# Patient Record
Sex: Male | Born: 1975 | Race: Black or African American | Hispanic: No | Marital: Married | Smoking: Never smoker
Health system: Southern US, Community
[De-identification: ages and names within clinical notes are randomized; demographics above are authoritative.]

## PROBLEM LIST (undated history)

## (undated) DIAGNOSIS — E669 Obesity, unspecified: Secondary | ICD-10-CM

## (undated) DIAGNOSIS — I10 Essential (primary) hypertension: Secondary | ICD-10-CM

## (undated) DIAGNOSIS — E870 Hyperosmolality and hypernatremia: Secondary | ICD-10-CM

## (undated) HISTORY — PX: ANKLE SURGERY: SHX546

## (undated) HISTORY — DX: Essential (primary) hypertension: I10

## (undated) HISTORY — DX: Hyperosmolality and hypernatremia: E87.0

## (undated) HISTORY — DX: Obesity, unspecified: E66.9

---

## 2017-03-02 ENCOUNTER — Emergency Department (HOSPITAL_COMMUNITY)
Admission: EM | Admit: 2017-03-02 | Discharge: 2017-03-02 | Disposition: A | Discharge status: Home / Self Care | Payer: Worker's Compensation | Attending: Physician Assistant | Admitting: Physician Assistant

## 2017-03-02 ENCOUNTER — Emergency Department: Payer: Worker's Compensation

## 2017-03-02 ENCOUNTER — Encounter (HOSPITAL_COMMUNITY): Payer: Self-pay | Admitting: Emergency Medicine

## 2017-03-02 ENCOUNTER — Encounter: Payer: Self-pay | Admitting: Emergency Medicine

## 2017-03-02 ENCOUNTER — Emergency Department
Admission: EM | Admit: 2017-03-02 | Discharge: 2017-03-02 | Disposition: A | Discharge status: Home / Self Care | Payer: Worker's Compensation | Attending: Emergency Medicine | Admitting: Emergency Medicine

## 2017-03-02 DIAGNOSIS — S0003XD Contusion of scalp, subsequent encounter: Secondary | ICD-10-CM | POA: Diagnosis not present

## 2017-03-02 DIAGNOSIS — Y9389 Activity, other specified: Secondary | ICD-10-CM | POA: Insufficient documentation

## 2017-03-02 DIAGNOSIS — R110 Nausea: Secondary | ICD-10-CM | POA: Diagnosis present

## 2017-03-02 DIAGNOSIS — S8002XA Contusion of left knee, initial encounter: Secondary | ICD-10-CM

## 2017-03-02 DIAGNOSIS — S0990XA Unspecified injury of head, initial encounter: Secondary | ICD-10-CM | POA: Diagnosis not present

## 2017-03-02 DIAGNOSIS — S060X0D Concussion without loss of consciousness, subsequent encounter: Secondary | ICD-10-CM | POA: Diagnosis not present

## 2017-03-02 DIAGNOSIS — T07XXXA Unspecified multiple injuries, initial encounter: Secondary | ICD-10-CM | POA: Insufficient documentation

## 2017-03-02 DIAGNOSIS — Y92410 Unspecified street and highway as the place of occurrence of the external cause: Secondary | ICD-10-CM | POA: Diagnosis not present

## 2017-03-02 DIAGNOSIS — Y990 Civilian activity done for income or pay: Secondary | ICD-10-CM | POA: Insufficient documentation

## 2017-03-02 LAB — COMPREHENSIVE METABOLIC PANEL
ALBUMIN: 3.9 g/dL (ref 3.5–5.0)
ALK PHOS: 61 U/L (ref 38–126)
ALT: 28 U/L (ref 17–63)
ALT: 29 U/L (ref 17–63)
ANION GAP: 11 (ref 5–15)
ANION GAP: 7 (ref 5–15)
AST: 30 U/L (ref 15–41)
AST: 31 U/L (ref 15–41)
Albumin: 4.2 g/dL (ref 3.5–5.0)
Alkaline Phosphatase: 62 U/L (ref 38–126)
BILIRUBIN TOTAL: 0.7 mg/dL (ref 0.3–1.2)
BUN: 21 mg/dL — ABNORMAL HIGH (ref 6–20)
BUN: 15 mg/dL (ref 6–20)
CALCIUM: 9.2 mg/dL (ref 8.9–10.3)
CHLORIDE: 109 mmol/L (ref 101–111)
CO2: 24 mmol/L (ref 22–32)
CO2: 24 mmol/L (ref 22–32)
Calcium: 9 mg/dL (ref 8.9–10.3)
Chloride: 107 mmol/L (ref 101–111)
Creatinine, Ser: 0.83 mg/dL (ref 0.61–1.24)
Creatinine, Ser: 0.85 mg/dL (ref 0.61–1.24)
GFR calc Af Amer: >60 mL/min (ref >60)
GLUCOSE: 105 mg/dL — AB (ref 65–99)
Glucose, Bld: 106 mg/dL — ABNORMAL HIGH (ref 65–99)
POTASSIUM: 3.6 mmol/L (ref 3.5–5.1)
POTASSIUM: 3.7 mmol/L (ref 3.5–5.1)
Sodium: 142 mmol/L (ref 135–145)
Sodium: 140 mmol/L (ref 135–145)
TOTAL PROTEIN: 7.3 g/dL (ref 6.5–8.1)
TOTAL PROTEIN: 6.8 g/dL (ref 6.5–8.1)
Total Bilirubin: 1.1 mg/dL (ref 0.3–1.2)

## 2017-03-02 LAB — CBC
HEMATOCRIT: 43.1 % (ref 40.0–52.0)
HEMATOCRIT: 41.9 % (ref 39.0–52.0)
HEMOGLOBIN: 14.1 g/dL (ref 13.0–18.0)
HEMOGLOBIN: 13.5 g/dL (ref 13.0–17.0)
MCH: 26 pg (ref 26.0–34.0)
MCH: 25.9 pg — ABNORMAL LOW (ref 26.0–34.0)
MCHC: 32.7 g/dL (ref 32.0–36.0)
MCHC: 32.2 g/dL (ref 30.0–36.0)
MCV: 79.6 fL — ABNORMAL LOW (ref 80.0–100.0)
MCV: 80.3 fL (ref 78.0–100.0)
Platelets: 193 10*3/uL (ref 150–440)
Platelets: 200 10*3/uL (ref 150–400)
RBC: 5.41 MIL/uL (ref 4.40–5.90)
RBC: 5.22 MIL/uL (ref 4.22–5.81)
RDW: 15 % — ABNORMAL HIGH (ref 11.5–14.5)
RDW: 14.7 % (ref 11.5–15.5)
WBC: 8.4 10*3/uL (ref 3.8–10.6)
WBC: 10.2 10*3/uL (ref 4.0–10.5)

## 2017-03-02 LAB — LIPASE, BLOOD: LIPASE: 31 U/L (ref 11–51)

## 2017-03-02 LAB — PROTIME-INR
INR: 0.99
PROTHROMBIN TIME: 13 s (ref 11.4–15.2)

## 2017-03-02 LAB — URINALYSIS, ROUTINE W REFLEX MICROSCOPIC
Bilirubin Urine: NEGATIVE
GLUCOSE, UA: NEGATIVE mg/dL
Hgb urine dipstick: NEGATIVE
KETONES UR: 5 mg/dL — AB
LEUKOCYTES UA: NEGATIVE
NITRITE: NEGATIVE
PH: 5 (ref 5.0–8.0)
Protein, ur: NEGATIVE mg/dL
Specific Gravity, Urine: 1.032 — ABNORMAL HIGH (ref 1.005–1.030)

## 2017-03-02 LAB — APTT: aPTT: 25 seconds (ref 24–36)

## 2017-03-02 MED ORDER — KETOROLAC TROMETHAMINE 30 MG/ML IJ SOLN
30.0000 mg | Freq: Once | INTRAMUSCULAR | Status: AC
  Administered 2017-03-02: 30 mg via INTRAVENOUS
  Filled 2017-03-02: qty 1

## 2017-03-02 MED ORDER — BUTALBITAL-APAP-CAFFEINE 50-325-40 MG PO TABS: 1.0000 | ORAL_TABLET | Freq: Four times a day (QID) | ORAL | 0 refills | Status: AC | PRN

## 2017-03-02 MED ORDER — ONDANSETRON 4 MG PO TBDP
4.0000 mg | ORAL_TABLET | Freq: Once | ORAL | Status: AC | PRN
  Administered 2017-03-02: 4 mg via ORAL

## 2017-03-02 MED ORDER — METOCLOPRAMIDE HCL 5 MG/ML IJ SOLN
10.0000 mg | Freq: Once | INTRAMUSCULAR | Status: AC
  Administered 2017-03-02: 10 mg via INTRAVENOUS
  Filled 2017-03-02: qty 2

## 2017-03-02 MED ORDER — SODIUM CHLORIDE 0.9 % IV BOLUS (SEPSIS)
1000.0000 mL | Freq: Once | INTRAVENOUS | Status: AC
  Administered 2017-03-02: 1000 mL via INTRAVENOUS

## 2017-03-02 MED ORDER — ONDANSETRON 4 MG PO TBDP
ORAL_TABLET | ORAL | Status: AC
  Filled 2017-03-02: qty 1

## 2017-03-02 MED ORDER — DIPHENHYDRAMINE HCL 50 MG/ML IJ SOLN
25.0000 mg | Freq: Once | INTRAMUSCULAR | Status: AC
  Administered 2017-03-02: 25 mg via INTRAVENOUS
  Filled 2017-03-02: qty 1

## 2017-03-02 MED ORDER — ONDANSETRON 4 MG PO TBDP: 4.0000 mg | ORAL_TABLET | Freq: Three times a day (TID) | ORAL | 0 refills | Status: DC | PRN

## 2017-03-02 MED ORDER — IBUPROFEN 400 MG PO TABS
400.0000 mg | ORAL_TABLET | Freq: Once | ORAL | Status: AC
  Administered 2017-03-02: 400 mg via ORAL
  Filled 2017-03-02: qty 1
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
## 2017-03-02 NOTE — ED Notes (Signed)
Wounds to left arm cleansed with soap and water. Abrasions and small lacerations present. Bleeding controlled and open to air. Pt also has small raised area on right upper thigh, informed dr. Cyril Loosen - told to have patient put ice on it. Pt given ice pack.
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
## 2017-03-02 NOTE — ED Notes (Signed)
Patient transported to CT and XR 

## 2017-03-02 NOTE — ED Notes (Signed)
TYPE and SCREEN tube and paperwork sent to the lab.
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
## 2017-03-13 ENCOUNTER — Other Ambulatory Visit: Payer: Self-pay | Admitting: Family Medicine

## 2017-03-13 ENCOUNTER — Ambulatory Visit
Admission: RE | Admit: 2017-03-13 | Discharge: 2017-03-13 | Disposition: A | Discharge status: Home / Self Care | Payer: Worker's Compensation | Source: Ambulatory Visit | Attending: Family Medicine | Admitting: Family Medicine

## 2017-03-13 DIAGNOSIS — M79605 Pain in left leg: Secondary | ICD-10-CM | POA: Diagnosis not present

## 2017-03-13 DIAGNOSIS — M1712 Unilateral primary osteoarthritis, left knee: Secondary | ICD-10-CM | POA: Diagnosis not present

## 2017-03-13 DIAGNOSIS — R609 Edema, unspecified: Secondary | ICD-10-CM | POA: Diagnosis not present

## 2018-01-02 DIAGNOSIS — M179 Osteoarthritis of knee, unspecified: Secondary | ICD-10-CM | POA: Diagnosis not present

## 2018-01-02 DIAGNOSIS — Z1389 Encounter for screening for other disorder: Secondary | ICD-10-CM | POA: Diagnosis not present

## 2018-01-02 DIAGNOSIS — Z0000 Encounter for general adult medical examination without abnormal findings: Secondary | ICD-10-CM | POA: Diagnosis not present

## 2018-01-16 DIAGNOSIS — M1712 Unilateral primary osteoarthritis, left knee: Secondary | ICD-10-CM | POA: Diagnosis not present

## 2018-01-16 DIAGNOSIS — M1711 Unilateral primary osteoarthritis, right knee: Secondary | ICD-10-CM | POA: Diagnosis not present
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
## 2018-06-27 DIAGNOSIS — J069 Acute upper respiratory infection, unspecified: Secondary | ICD-10-CM | POA: Diagnosis not present

## 2018-10-07 DIAGNOSIS — R21 Rash and other nonspecific skin eruption: Secondary | ICD-10-CM | POA: Diagnosis not present

## 2019-02-05 ENCOUNTER — Ambulatory Visit: Payer: Self-pay | Admitting: Internal Medicine

## 2019-02-27 ENCOUNTER — Ambulatory Visit: Payer: Self-pay

## 2019-02-27 DIAGNOSIS — Z23 Encounter for immunization: Secondary | ICD-10-CM

## 2019-03-12 IMAGING — US US EXTREM LOW VENOUS*L*
1 series · 13 of 24 positions shown · non-contrast
Comparison: None.

CLINICAL DATA: Edema.  Evaluate for DVT.



[Series 1: us extrem low venous*left* · 0.10mm/px · 13 of 34 slices shown]
[im 1/34]
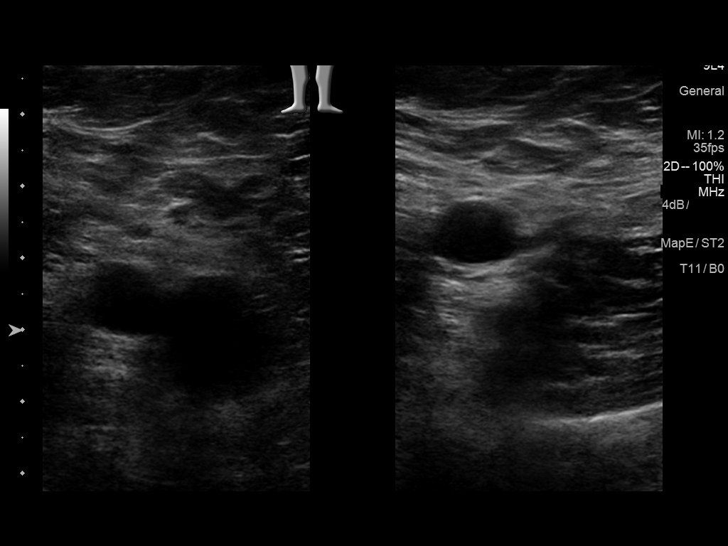
[im 3/34]
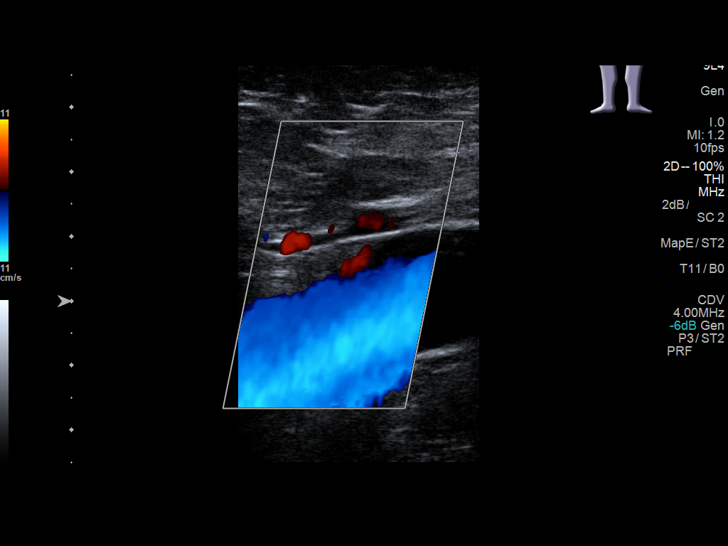
[im 6/34]
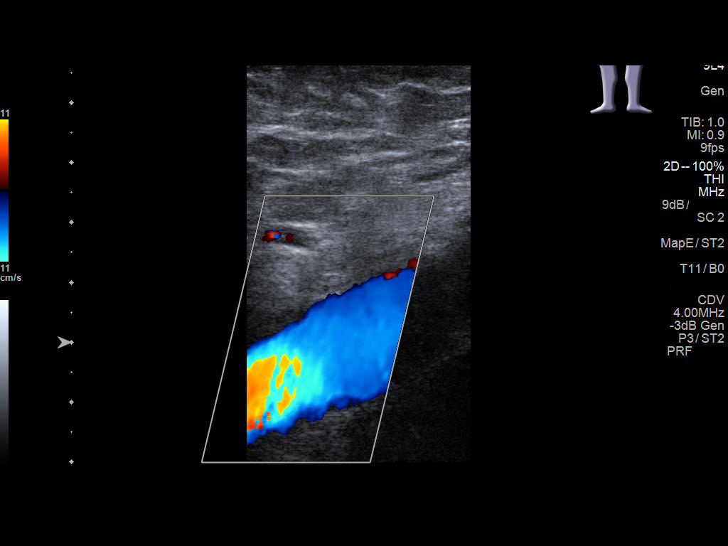
[im 9/34]
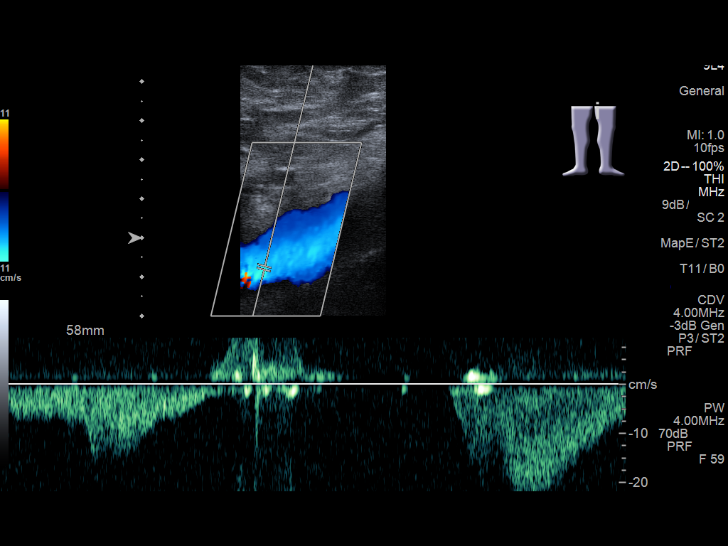
[im 12/34]
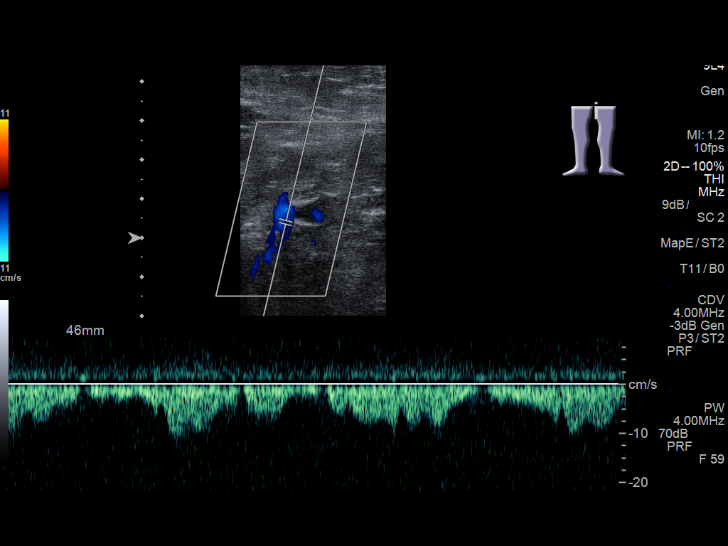
[im 15/34]
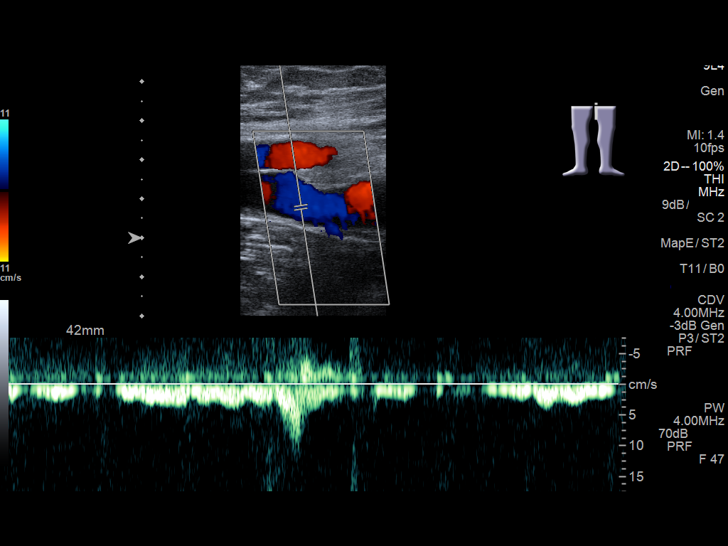
[im 18/34]
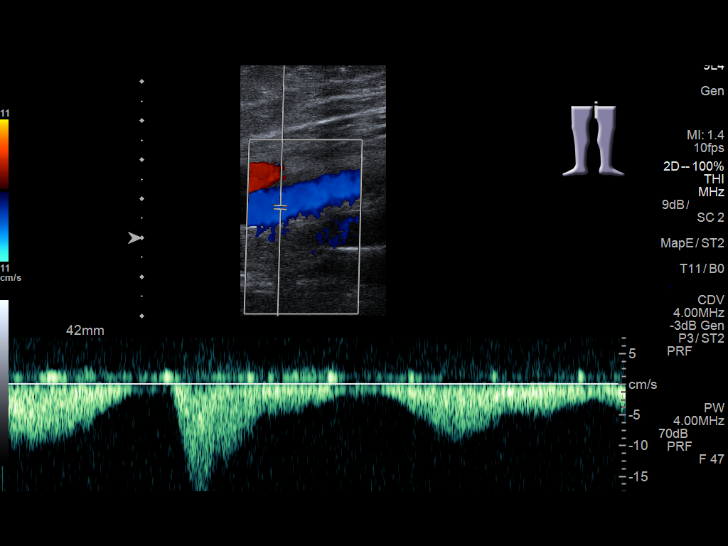
[im 19/34]
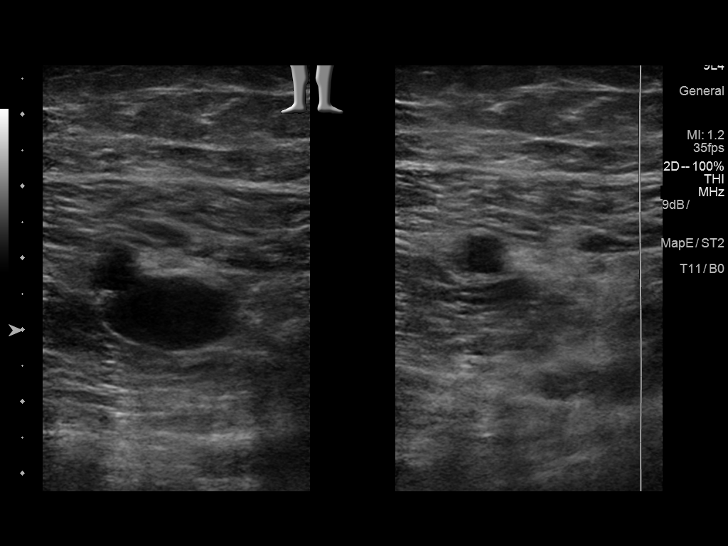
[im 22/34]
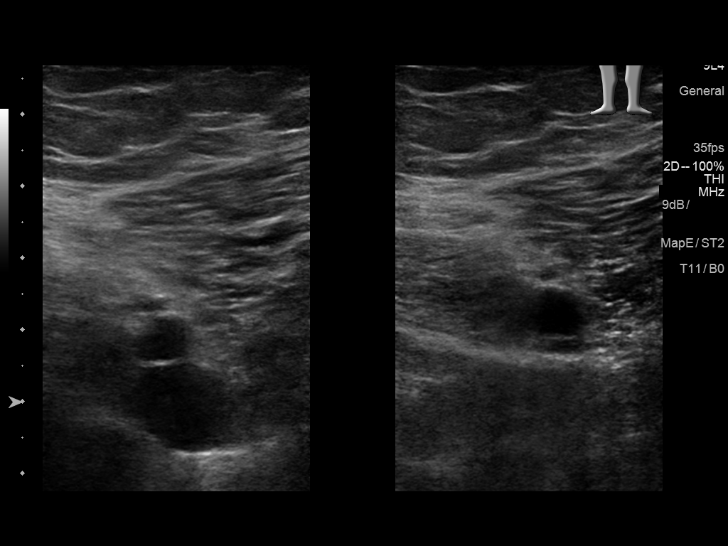
[im 25/34]
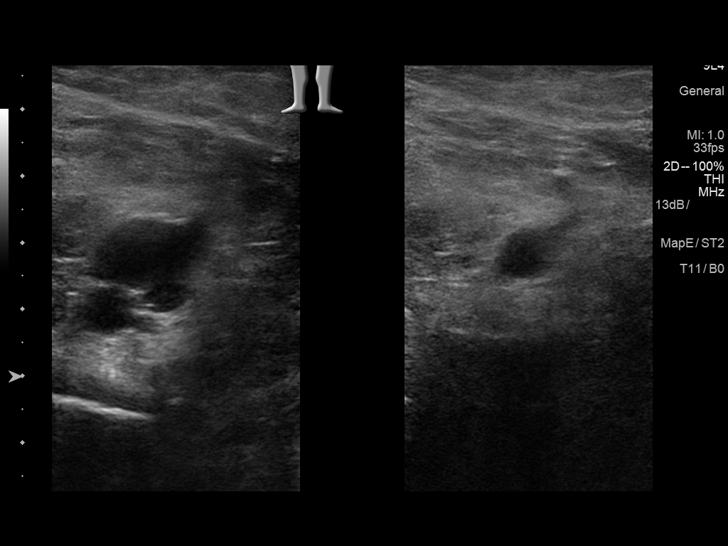
[im 28/34]
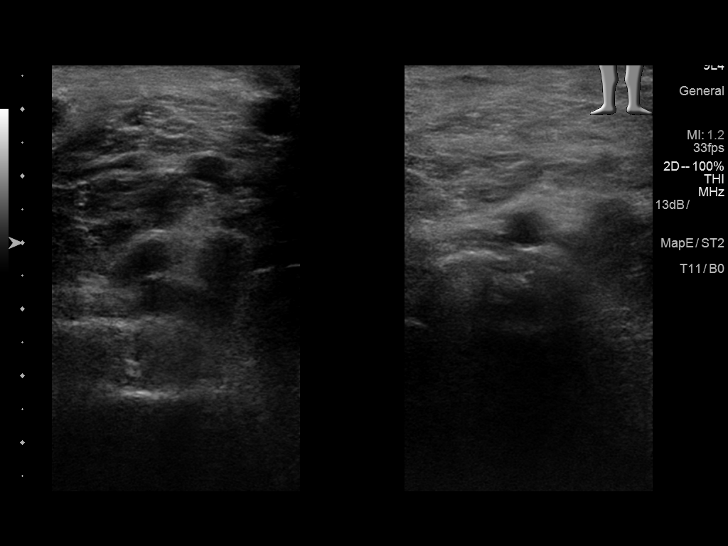
[im 31/34]
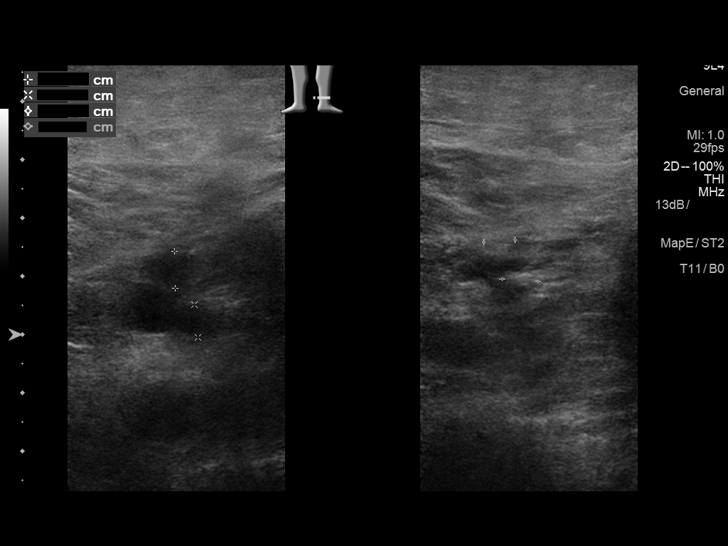
[im 34/34]
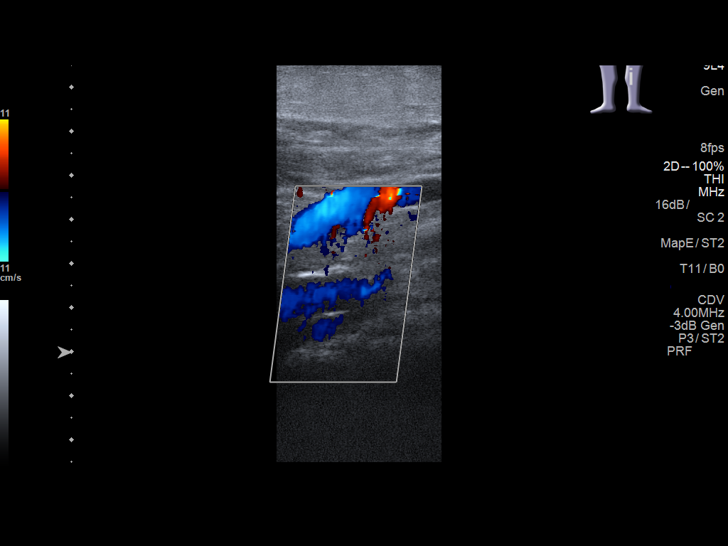

[13 of 24 positions shown; findings below may reference images not displayed]

FINDINGS: Contralateral Common Femoral Vein: Respiratory phasicity is normal
and symmetric with the symptomatic side. No evidence of thrombus.
Normal compressibility.

Common Femoral Vein: No evidence of thrombus. Normal
compressibility, respiratory phasicity and response to augmentation.

Saphenofemoral Junction: No evidence of thrombus. Normal
compressibility and flow on color Doppler imaging.

Profunda Femoral Vein: No evidence of thrombus. Normal
compressibility and flow on color Doppler imaging.

Femoral Vein: No evidence of thrombus. Normal compressibility,
respiratory phasicity and response to augmentation.

Popliteal Vein: No evidence of thrombus. Normal compressibility,
respiratory phasicity and response to augmentation.

Calf Veins: No evidence of thrombus. Normal compressibility and flow
on color Doppler imaging.

Superficial Great Saphenous Vein: No evidence of thrombus. Normal
compressibility.

Other Findings:  None.
IMPRESSION: No evidence of deep venous thrombosis.

## 2019-03-19 ENCOUNTER — Encounter: Payer: Self-pay | Admitting: Registered Nurse

## 2019-03-19 ENCOUNTER — Ambulatory Visit: Payer: 59 | Admitting: Registered Nurse

## 2019-03-19 ENCOUNTER — Other Ambulatory Visit: Payer: Self-pay

## 2019-03-19 VITALS — BP 140/80 | HR 64 | Temp 98.5°F | Resp 12 | Ht 69.0 in | Wt 270.0 lb

## 2019-03-19 DIAGNOSIS — I10 Essential (primary) hypertension: Secondary | ICD-10-CM

## 2019-03-19 DIAGNOSIS — J301 Allergic rhinitis due to pollen: Secondary | ICD-10-CM

## 2019-03-19 DIAGNOSIS — Z0289 Encounter for other administrative examinations: Secondary | ICD-10-CM

## 2019-03-19 LAB — POCT URINALYSIS DIPSTICK
Bilirubin, UA: NEGATIVE
Blood, UA: NEGATIVE
Glucose, UA: NEGATIVE
Ketones, UA: NEGATIVE
Leukocytes, UA: NEGATIVE
Nitrite, UA: NEGATIVE
Protein, UA: NEGATIVE
Spec Grav, UA: 1.025 (ref 1.010–1.025)
Urobilinogen, UA: 0.2 E.U./dL
pH, UA: 6 (ref 5.0–8.0)

## 2019-03-19 NOTE — Patient Instructions (Addendum)
Preventing Health Risks of Being Overweight Maintaining a healthy body weight is an important part of your overall health. Your healthy body weight depends on your age, gender, and height. Being overweight puts you at risk for many health problems, including:  Heart disease.  Diabetes.  Problems sleeping.  Joint problems. You can make changes to your diet and lifestyle to prevent these risks. Consider working with a health care provider or a dietitian to make these changes. What nutrition changes can be made?   Eat only as much as your body needs. In most cases, this is about 2,000 calories a day, but the amount varies depending on your height, gender, and activity level. Ask your health care provider how many calories you should have each day. Eating more than your body needs on a regular basis can cause you to become overweight or obese.  Eat slowly, and stop eating when you feel full.  Choose healthy foods, including: ? Fruits and vegetables. ? Lean meats. ? Low-fat dairy products. ? High-fiber foods, such as whole grains and beans. ? Healthy snacks like vegetable sticks, a piece of fruit, or a small amount of yogurt or cheese.  Avoid foods and drinks that are high in sugar, salt (sodium), saturated fat, or trans fat. This includes: ? Many desserts such as candy, cookies, and ice cream. ? Soda. ? Fried foods. ? Processed meats such as hot dogs or lunch meats. ? Prepackaged snack foods. What lifestyle changes can be made?   Exercise for at least 150 minutes a week to prevent weight gain, or as often as recommended by your health care provider. Do moderate-intensity exercise, such as brisk walking. ? Spread it out by exercising for 30 minutes 5 days a week, or in short 10-minute bursts several times a day.  Find other ways to stay active and burn calories, such as yard work or a hobby that involves physical activity.  Get at least 8 hours of sleep each night. When you are  well-rested, you are more likely to be active and make healthy choices during the day. To sleep better: ? Try to go to bed and wake up at about the same time every day. ? Keep your bedroom dark, quiet, and cool. ? Make sure that your bed is comfortable. ? Avoid stimulating activities, such as watching television or exercising, for at least one hour before bedtime. Why are these changes important? Eating healthy and being active helps you lose weight and prevent health problems caused by being overweight. Making these changes can also help you manage stress, feel better mentally, and connect with friends and family. What can happen if changes are not made? Being overweight can affect you for your entire life. You may develop joint or bone problems that make it painful or difficult for you to play sports or do activities you enjoy. Being overweight puts stress on your heart and lungs and can lead to medical problems like diabetes, heart disease, and sleeping problems. Where to find support You can get support for preventing health risks of being overweight from:  Your health care provider or a dietitian. They can provide guidance about healthy eating and healthy lifestyle choices.  Weight loss support groups, online or in-person. Where to find more information  MyPlate: FormerBoss.no ? This an online tool that provides personalized recommendations about foods to eat each day.  The Centers for Disease Control and Prevention: http://sharp-hammond.biz/ ? This resource gives tips for managing weight and having an active lifestyle.  Summary  To prevent unhealthy weight gain, it is important to maintain a healthy diet high in vegetables and whole grains, exercise regularly, and get at least 8 hours of sleep each night.  Making these changes helps prevent many long-term (chronic) health conditions that can shorten your life, such as diabetes, heart disease, and stroke. This information is  not intended to replace advice given to you by your health care provider. Make sure you discuss any questions you have with your health care provider. Document Released: 04/11/2017 Document Revised: 05/18/2017 Document Reviewed: 04/11/2017 Elsevier Patient Education  2020 [employer]. Managing Your Hypertension Hypertension is commonly called high blood pressure. This is when the force of your blood pressing against the walls of your arteries is too strong. Arteries are blood vessels that carry blood from your heart throughout your body. Hypertension forces the heart to work harder to pump blood, and may cause the arteries to become narrow or stiff. Having untreated or uncontrolled hypertension can cause heart attack, stroke, kidney disease, and other problems. What are blood pressure readings? A blood pressure reading consists of a higher number over a lower number. Ideally, your blood pressure should be below 120/80. The first ("top") number is called the systolic pressure. It is a measure of the pressure in your arteries as your heart beats. The second ("bottom") number is called the diastolic pressure. It is a measure of the pressure in your arteries as the heart relaxes. What does my blood pressure reading mean? Blood pressure is classified into four stages. Based on your blood pressure reading, your health care provider may use the following stages to determine what type of treatment you need, if any. Systolic pressure and diastolic pressure are measured in a unit called mm Hg. Normal  Systolic pressure: below 123456.  Diastolic pressure: below 80. Elevated  Systolic pressure: [id number].  Diastolic pressure: below 80. Hypertension stage 1  Systolic pressure: 0000000.  Diastolic pressure: [id number]. Hypertension stage 2  Systolic pressure: [id number] or above.  Diastolic pressure: 90 or above. What health risks are associated with hypertension? Managing your hypertension is an important  responsibility. Uncontrolled hypertension can lead to:  A heart attack.  A stroke.  A weakened blood vessel (aneurysm).  Heart failure.  Kidney damage.  Eye damage.  Metabolic syndrome.  Memory and concentration problems. What changes can I make to manage my hypertension? Hypertension can be managed by making lifestyle changes and possibly by taking medicines. Your health care provider will help you make a plan to bring your blood pressure within a normal range. Eating and drinking   Eat a diet that is high in fiber and potassium, and low in salt (sodium), added sugar, and fat. An example eating plan is called the DASH (Dietary Approaches to Stop Hypertension) diet. To eat this way: ? Eat plenty of fresh fruits and vegetables. Try to fill half of your plate at each meal with fruits and vegetables. ? Eat whole grains, such as whole wheat pasta, brown rice, or whole grain bread. Fill about one quarter of your plate with whole grains. ? Eat low-fat diary products. ? Avoid fatty cuts of meat, processed or cured meats, and poultry with skin. Fill about one quarter of your plate with lean proteins such as fish, chicken without skin, beans, eggs, and tofu. ? Avoid premade and processed foods. These tend to be higher in sodium, added sugar, and fat.  Reduce your daily sodium intake. Most people with hypertension should eat less than 1,500  mg of sodium a day.  Limit alcohol intake to no more than 1 drink a day for nonpregnant women and 2 drinks a day for men. One drink equals 12 oz of beer, 5 oz of wine, or 1 oz of hard liquor. Lifestyle  Work with your health care provider to maintain a healthy body weight, or to lose weight. Ask what an ideal weight is for you.  Get at least 30 minutes of exercise that causes your heart to beat faster (aerobic exercise) most days of the week. Activities may include walking, swimming, or biking.  Include exercise to strengthen your muscles (resistance  exercise), such as weight lifting, as part of your weekly exercise routine. Try to do these types of exercises for 30 minutes at least 3 days a week.  Do not use any products that contain nicotine or tobacco, such as cigarettes and e-cigarettes. If you need help quitting, ask your health care provider.  Control any long-term (chronic) conditions you have, such as high cholesterol or diabetes. Monitoring  Monitor your blood pressure at home as told by your health care provider. Your personal target blood pressure may vary depending on your medical conditions, your age, and other factors.  Have your blood pressure checked regularly, as often as told by your health care provider. Working with your health care provider  Review all the medicines you take with your health care provider because there may be side effects or interactions.  Talk with your health care provider about your diet, exercise habits, and other lifestyle factors that may be contributing to hypertension.  Visit your health care provider regularly. Your health care provider can help you create and adjust your plan for managing hypertension. Will I need medicine to control my blood pressure? Your health care provider may prescribe medicine if lifestyle changes are not enough to get your blood pressure under control, and if:  Your systolic blood pressure is 130 or higher.  Your diastolic blood pressure is 80 or higher. Take medicines only as told by your health care provider. Follow the directions carefully. Blood pressure medicines must be taken as prescribed. The medicine does not work as well when you skip doses. Skipping doses also puts you at risk for problems. Contact a health care provider if:  You think you are having a reaction to medicines you have taken.  You have repeated (recurrent) headaches.  You feel dizzy.  You have swelling in your ankles.  You have trouble with your vision. Get help right away if:   You develop a severe headache or confusion.  You have unusual weakness or numbness, or you feel faint.  You have severe pain in your chest or abdomen.  You vomit repeatedly.  You have trouble breathing. Summary  Hypertension is when the force of blood pumping through your arteries is too strong. If this condition is not controlled, it may put you at risk for serious complications.  Your personal target blood pressure may vary depending on your medical conditions, your age, and other factors. For most people, a normal blood pressure is less than 120/80.  Hypertension is managed by lifestyle changes, medicines, or both. Lifestyle changes include weight loss, eating a healthy, low-sodium diet, exercising more, and limiting alcohol. This information is not intended to replace advice given to you by your health care provider. Make sure you discuss any questions you have with your health care provider. Document Released: 02/07/2012 Document Revised: 09/06/2018 Document Reviewed: 04/12/2016 Elsevier Patient Education  2020  [employer]   How to Perform a Sinus Rinse A sinus rinse is a home treatment that is used to rinse your sinuses with a sterile mixture of salt and water (saline solution). Sinuses are air-filled spaces in your skull behind the bones of your face and forehead that open into your nasal cavity. A sinus rinse can help to clear mucus, dirt, dust, or pollen from your nasal cavity. You may do a sinus rinse when you have a cold, a virus, nasal allergy symptoms, a sinus infection, or stuffiness in your nose or sinuses. Talk with your health care provider about whether a sinus rinse might help you. What are the risks? A sinus rinse is generally safe and effective. However, there are a few risks, which include:  A burning sensation in your sinuses. This may happen if you do not make the saline solution as directed. Be sure to follow all directions when making the saline solution.   Nasal irritation.  Infection from contaminated water. This is rare, but possible. Do not do a sinus rinse if you have had ear or nasal surgery, ear infection, or blocked ears. Supplies needed:  Saline solution or powder.  Distilled or sterile water may be needed to mix with saline powder. ? You may use boiled and cooled tap water. Boil tap water for 5 minutes; cool until it is lukewarm. Use within 24 hours. ? Do not use regular tap water to mix with the saline solution.  Neti pot or nasal rinse bottle. These supplies release the saline solution into your nose and through your sinuses. Neti pots and nasal rinse bottles can be purchased at [employer], a health food store, or online. How to perform a sinus rinse  1. Wash your hands with soap and water. 2. Wash your device according to the directions that came with the product and then dry it. 3. Use the solution that comes with your product or one that is sold separately in stores. Follow the mixing directions on the package if you need to mix with sterile or distilled water. 4. Fill the device with the amount of saline solution noted in the device instructions. 5. Stand over a sink and tilt your head sideways over the sink. 6. Place the spout of the device in your upper nostril (the one closer to the ceiling). 7. Gently pour or squeeze the saline solution into your nasal cavity. The liquid should drain out from the lower nostril if you are not too congested. 8. While rinsing, breathe through your open mouth. 9. Gently blow your nose to clear any mucus and rinse solution. Blowing too hard may cause ear pain. 10. Repeat in your other nostril. 11. Clean and rinse your device with clean water and then air-dry it. Talk with your health care provider or pharmacist if you have questions about how to do a sinus rinse. Summary  A sinus rinse is a home treatment that is used to rinse your sinuses with a sterile mixture of salt and water  (saline solution).  A sinus rinse is generally safe and effective. Follow all instructions carefully.  Before doing a sinus rinse, talk with your health care provider about whether it would be helpful for you. This information is not intended to replace advice given to you by your health care provider. Make sure you discuss any questions you have with your health care provider. Document Released: 12/10/2013 Document Revised: 03/12/2017 Document Reviewed: 03/12/2017 Elsevier Patient Education  2020 [employer]. Allergic  Rhinitis, Adult Allergic rhinitis is an allergic reaction that affects the mucous membrane inside the nose. It causes sneezing, a runny or stuffy nose, and the feeling of mucus going down the back of the throat (postnasal drip). Allergic rhinitis can be mild to severe. There are two types of allergic rhinitis:  Seasonal. This type is also called hay fever. It happens only during certain seasons.  Perennial. This type can happen at any time of the year. What are the causes? This condition happens when the body's defense system (immune system) responds to certain harmless substances called allergens as though they were germs.  Seasonal allergic rhinitis is triggered by pollen, which can come from grasses, trees, and weeds. Perennial allergic rhinitis may be caused by:  House dust mites.  Pet dander.  Mold spores. What are the signs or symptoms? Symptoms of this condition include:  Sneezing.  Runny or stuffy nose (nasal congestion).  Postnasal drip.  Itchy nose.  Tearing of the eyes.  Trouble sleeping.  Daytime sleepiness. How is this diagnosed? This condition may be diagnosed based on:  Your medical history.  A physical exam.  Tests to check for related conditions, such as: ? Asthma. ? Pink eye. ? Ear infection. ? Upper respiratory infection.  Tests to find out which allergens trigger your symptoms. These may include skin or blood tests. How is  this treated? There is no cure for this condition, but treatment can help control symptoms. Treatment may include:  Taking medicines that block allergy symptoms, such as antihistamines. Medicine may be given as a shot, nasal spray, or pill.  Avoiding the allergen.  Desensitization. This treatment involves getting ongoing shots until your body becomes less sensitive to the allergen. This treatment may be done if other treatments do not help.  If taking medicine and avoiding the allergen does not work, new, stronger medicines may be prescribed. Follow these instructions at home:  Find out what you are allergic to. Common allergens include smoke, dust, and pollen.  Avoid the things you are allergic to. These are some things you can do to help avoid allergens: ? Replace carpet with wood, tile, or vinyl flooring. Carpet can trap dander and dust. ? Do not smoke. Do not allow smoking in your home. ? Change your heating and air conditioning filter at least once a month. ? During allergy season:  Keep windows closed as much as possible.  Plan outdoor activities when pollen counts are lowest. This is usually during the evening hours.  When coming indoors, change clothing and shower before sitting on furniture or bedding.  Take over-the-counter and prescription medicines only as told by your health care provider.  Keep all follow-up visits as told by your health care provider. This is important. Contact a health care provider if:  You have a fever.  You develop a persistent cough.  You make whistling sounds when you breathe (you wheeze).  Your symptoms interfere with your normal daily activities. Get help right away if:  You have shortness of breath. Summary  This condition can be managed by taking medicines as directed and avoiding allergens.  Contact your health care provider if you develop a persistent cough or fever.  During allergy season, keep windows closed as much as  possible. This information is not intended to replace advice given to you by your health care provider. Make sure you discuss any questions you have with your health care provider. Document Released: 02/07/2001 Document Revised: 04/27/2017 Document Reviewed: 06/22/2016 Elsevier Patient  Education  [employer].

## 2019-03-19 NOTE — Progress Notes (Signed)
Eye Exam Rt = 20/30 Lt = 20/30 Bt = 20/30  BP = 140/80 (Manually Recheck by RN Raenette Rover)  AMD

## 2019-03-19 NOTE — Progress Notes (Signed)
Subjective:    Patient ID: Johnathan Owens, male    DOB: 08-19-1975, 43 y.o.   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  Glasses 43 years old; he is going to schedule routine appt with optometry wants new frames.  Denied ER/hospitalizations in past two years, chest pain, dyspnea, visual changes, headaches.  When younger high BP symptom would be head rushes denied those now also.  Sometimes has sock lines at the end of the day  Denied whole lower leg swelling/palpitations/chest pain.  His exercise bike out of commission has been unable to get a new one due to covid/everywhere sold out and has gained weight.  He reports his blood pressure sensitive to weight gains.  In his life weight max 285; lowest in past 5 years 245.  Currently 270 lbs.  Last labs Feb 2020.  Last appt with Dr Dorris Fetch for weight/BP 10/30/2018 and started on amlodipine 5mg  po daily.  Patient reports "my BP always high at doctor and if machine used"  "I am wondering if amlodipine working as it doesn't make me pee"  Patient previously took diovan for blood pressure.  See FMCSA 5875  FHx-dad HTN  Allergies bothering him some; was worse last month; he is taking his claritin every day.  Hasn't used nasal saline in the past and not showering prior to bedtime.     Review of Systems  Constitutional: Positive for activity change. Negative for appetite change, chills, diaphoresis, fatigue, fever and unexpected weight change.  HENT: Positive for congestion, postnasal drip and rhinorrhea. Negative for dental problem, drooling, ear discharge, ear pain, facial swelling, hearing loss, mouth sores, nosebleeds, sinus pressure, sinus pain, sore throat, tinnitus, trouble swallowing and voice change.   Eyes: Negative for photophobia, pain, discharge, redness, itching and visual disturbance.  Respiratory: Negative for  cough, choking, chest tightness, shortness of breath, wheezing and stridor.   Cardiovascular: Negative for chest pain, palpitations and leg swelling.  Gastrointestinal: Negative for abdominal distention, abdominal pain, anal bleeding, blood in stool, constipation, diarrhea, nausea and vomiting.  Endocrine: Negative for cold intolerance and heat intolerance.  Musculoskeletal: Negative for arthralgias, back pain, gait problem, joint swelling, myalgias, neck pain and neck stiffness.  Skin: Negative for color change, pallor, rash and wound.  Allergic/Immunologic: Positive for environmental allergies. Negative for food allergies.  Neurological: Negative for dizziness, tremors, seizures, syncope, facial asymmetry, speech difficulty, weakness, light-headedness, numbness and headaches.  Hematological: Negative for adenopathy. Does not bruise/bleed easily.  Psychiatric/Behavioral: Negative for agitation, confusion and sleep disturbance.       Objective:   Physical Exam Vitals signs and nursing note reviewed. Exam conducted with a chaperone present.  Constitutional:      General: He is awake. He is not in acute distress.    Appearance: Normal appearance. He is well-developed and well-groomed. He is obese. He is not ill-appearing, toxic-appearing or diaphoretic.  HENT:     Head: Normocephalic and atraumatic.     Jaw: There is normal jaw occlusion.     Salivary Glands: Right salivary gland is not diffusely enlarged or tender. Left salivary gland is not diffusely enlarged or tender.     Right Ear: Hearing, tympanic membrane, ear canal and external ear normal. There is no impacted cerumen.     Left Ear: Hearing, tympanic membrane, ear canal and external ear normal. There is no impacted cerumen.     Nose: Nose  normal. No congestion or rhinorrhea.     Right Turbinates: Not swollen or pale.     Left Turbinates: Not swollen or pale.     Right Sinus: No maxillary sinus tenderness or frontal sinus  tenderness.     Left Sinus: No maxillary sinus tenderness or frontal sinus tenderness.     Mouth/Throat:     Lips: Pink. No lesions.     Mouth: Mucous membranes are moist. No injury, lacerations, oral lesions or angioedema.     Dentition: No gingival swelling or gum lesions.     Tongue: No lesions. Tongue does not deviate from midline.     Palate: No mass and lesions.     Pharynx: Oropharynx is clear. Uvula midline. No uvula swelling.     Tonsils: No tonsillar exudate. 2+ on the right. 2+ on the left.     Comments: Wearing cloth mask due to covid 19 pandemic; clear discharge bilateral nasal turbinates; tonsils enlarged not erythematous or exudate 2+/4 bilaterally; bilateral allergic shiners Eyes:     General: Lids are normal. Vision grossly intact. Gaze aligned appropriately. Allergic shiner present. No visual field deficit or scleral icterus.       Right eye: No discharge.        Left eye: No discharge.     Extraocular Movements: Extraocular movements intact.     Conjunctiva/sclera: Conjunctivae normal.     Pupils: Pupils are equal, round, and reactive to light.  Neck:     Musculoskeletal: Normal range of motion and neck supple. No neck rigidity or muscular tenderness.     Trachea: Trachea and phonation normal.  Cardiovascular:     Rate and Rhythm: Normal rate and regular rhythm.     Pulses: Normal pulses.          Radial pulses are 2+ on the right side and 2+ on the left side.     Heart sounds: Normal heart sounds.  Pulmonary:     Effort: Pulmonary effort is normal. No respiratory distress.     Breath sounds: Normal breath sounds and air entry. No stridor, decreased air movement or transmitted upper airway sounds. No decreased breath sounds, wheezing, rhonchi or rales.     Comments: No cough observed in exam room; spoke full sentences without difficulty Abdominal:     General: Abdomen is flat. Bowel sounds are normal. There is no distension.     Palpations: Abdomen is soft. There is  no shifting dullness, fluid wave, hepatomegaly, splenomegaly, mass or pulsatile mass.     Tenderness: There is no abdominal tenderness. There is no right CVA tenderness, left CVA tenderness, guarding or rebound. Negative signs include Murphy's sign.     Hernia: No hernia is present. There is no hernia in the umbilical area, left inguinal area or right inguinal area.     Comments: Dull to percussion x 4 quads; normoactive bowel sounds x 4 quads  Genitourinary:    Pubic Area: No rash or pubic lice.      Penis: Normal and circumcised. No discharge or swelling.      Scrotum/Testes: Cremasteric reflex is present.     Tanner stage (genital): 5.     Comments: RN Elna Breslow chaperoned inguinal bilateral hernia exam Musculoskeletal: Normal range of motion.        General: No swelling, tenderness, deformity or signs of injury.     Right shoulder: Normal.     Left shoulder: Normal.     Right elbow: Normal.    Left  elbow: Normal.     Right hip: Normal.     Left hip: Normal.     Right knee: Normal.     Left knee: Normal.     Right ankle: Normal.     Left ankle: Normal.     Cervical back: Normal.     Thoracic back: Normal.     Lumbar back: Normal.     Right hand: Normal.     Left hand: Normal.     Right lower leg: No edema.     Left lower leg: No edema.  Lymphadenopathy:     Head:     Right side of head: No submental, submandibular, tonsillar, preauricular, posterior auricular or occipital adenopathy.     Left side of head: No submental, submandibular, tonsillar, preauricular, posterior auricular or occipital adenopathy.     Cervical: No cervical adenopathy.     Right cervical: No superficial, deep or posterior cervical adenopathy.    Left cervical: No superficial, deep or posterior cervical adenopathy.     Lower Body: No right inguinal adenopathy. No left inguinal adenopathy.  Skin:    General: Skin is warm and dry.     Capillary Refill: Capillary refill takes less than 2 seconds.      Coloration: Skin is not ashen, cyanotic, jaundiced, mottled, pale or sallow.     Findings: No abrasion, abscess, acne, bruising, burn, ecchymosis, erythema, signs of injury, laceration, lesion, petechiae, rash or wound.     Nails: There is no clubbing.        Neurological:     General: No focal deficit present.     Mental Status: He is alert and oriented to person, place, and time. Mental status is at baseline.     GCS: GCS eye subscore is 4. GCS verbal subscore is 5. GCS motor subscore is 6.     Cranial Nerves: Cranial nerves are intact. No cranial nerve deficit, dysarthria or facial asymmetry.     Sensory: Sensation is intact. No sensory deficit.     Motor: Motor function is intact. No weakness, tremor, atrophy, abnormal muscle tone or seizure activity.     Coordination: Coordination is intact. Coordination normal.     Gait: Gait is intact. Gait normal.     Deep Tendon Reflexes:     Reflex Scores:      Patellar reflexes are 3+ on the right side and 3+ on the left side.    Comments: On\off exam table without difficulty; able to touch toes easily; upper and lower extremities equal 5/5 strength and hand grasp;   Psychiatric:        Attention and Perception: Attention and perception normal.        Mood and Affect: Mood and affect normal.        Speech: Speech normal.        Behavior: Behavior normal. Behavior is cooperative.        Thought Content: Thought content normal.        Cognition and Memory: Cognition and memory normal.        Judgment: Judgment normal.           Assessment & Plan:  A-encounter for DOT medical certificate, essential hypertension, obesity BMI 39.87, seasonal allergic rhinitis  P-patient encounter entered into national database; see paper copy FMCSA 5875 and 5876 [facility].  Due to hypertension only 1 year certificate given.   Patient to follow up in 1 year.  Sooner  if new medical problems/hospitalization/ER  visit.  Patient instructed to send in copy FMCSA 5876 per [place] DOT directives.  Patient verbalized understanding information/instructions, agreed with plan of care and had no further questions at this time.  Patient concerned his blood pressure should be lower and that he will gain weight over holidays and BP will worsen since he still has been unable to obtain a bike.  Discussed parking at end of parking lot, continuing to walk dog.  Working up to 150 minutes walking per week and controlling his portion sizes at meals.  Healthy choices fresh fruits/vegetables, lean meat and dairy versus fast food/packaged foods and increasing fiber intake carbohydrates e.g. brown rice versus white rice and high fiber bread versus wonder white bread.  Keeping adding sugars under 9 teaspoons per day/150 calories per American Heart Association.  Patient given printed managing your hypertension and obesity exitcare handouts today.  Consider DASH and mediterranean diets.  Follow up in 2-3 weeks for repeat blood pressure after increasing to 2 tabs po daily ( ) amlodipine.  Discussed if dizzy, sob, chest pain to follow up same day for re-evaluation.  He does not have a blood pressure machine at home.  Discussed not uncommon to feel fatigued/tired until body adjusts to new lower blood pressure.  Patient recently filled his amlodipine and has enough supply at home for trial of  dosing.  Discussed amlodipine is not a diuretic and doesn't make him pee like diovan did.  Patient to walk in on a Friday for BP checks with RN Paschal Dopp.  Avoid weight gain.  Work on [employer] loss/therapeutic lifestyle changes.  Patient verbalized understanding information/instructions, agreed with plan of care and had no further questions at this time.  Patient may use normal saline nasal spray 2 sprays each nostril q2h wa as needed given 1 bottle from clinic stock.  consider flonase 1 spray each nostril BID OTC if no improvement with claritin, nasal  saline and showering prior to bedtime.    Patient denied personal or family history of ENT cancer.  OTC antihistamine of choice claritin  po daily.  Avoid triggers if possible.  Shower prior to bedtime if exposed to triggers.  If allergic dust/dust mites recommend mattress/pillow covers/encasements; washing linens, vacuuming, sweeping, dusting weekly.  Call or return to clinic as needed if these symptoms worsen or fail to improve as anticipated.   Exitcare handout on allergic rhinitis and sinus rinse.  Patient verbalized understanding of instructions, agreed with plan of care and had no further questions at this time.  P2:  Avoidance and hand washing.

## 2019-04-20 DIAGNOSIS — Z20828 Contact with and (suspected) exposure to other viral communicable diseases: Secondary | ICD-10-CM | POA: Diagnosis not present

## 2019-04-22 DIAGNOSIS — J029 Acute pharyngitis, unspecified: Secondary | ICD-10-CM | POA: Diagnosis not present

## 2019-05-28 ENCOUNTER — Ambulatory Visit: Payer: Managed Care, Other (non HMO) | Attending: Internal Medicine

## 2019-05-28 DIAGNOSIS — Z20822 Contact with and (suspected) exposure to covid-19: Secondary | ICD-10-CM

## 2019-05-28 DIAGNOSIS — Z20828 Contact with and (suspected) exposure to other viral communicable diseases: Secondary | ICD-10-CM | POA: Diagnosis not present

## 2019-05-30 LAB — NOVEL CORONAVIRUS, NAA: SARS-CoV-2, NAA: NOT DETECTED

## 2019-07-13 DIAGNOSIS — Z0100 Encounter for examination of eyes and vision without abnormal findings: Secondary | ICD-10-CM | POA: Diagnosis not present

## 2019-08-13 DIAGNOSIS — R5383 Other fatigue: Secondary | ICD-10-CM | POA: Diagnosis not present

## 2019-08-13 DIAGNOSIS — I10 Essential (primary) hypertension: Secondary | ICD-10-CM | POA: Diagnosis not present

## 2019-09-04 DIAGNOSIS — I10 Essential (primary) hypertension: Secondary | ICD-10-CM | POA: Diagnosis not present

## 2019-09-04 DIAGNOSIS — R5383 Other fatigue: Secondary | ICD-10-CM | POA: Diagnosis not present

## 2019-09-08 ENCOUNTER — Other Ambulatory Visit: Payer: Self-pay

## 2019-09-08 DIAGNOSIS — I10 Essential (primary) hypertension: Secondary | ICD-10-CM

## 2019-09-10 ENCOUNTER — Telehealth: Payer: Self-pay | Admitting: Registered Nurse

## 2019-09-10 ENCOUNTER — Encounter: Payer: Self-pay | Admitting: Registered Nurse

## 2019-09-10 MED ORDER — AMLODIPINE BESYLATE 5 MG PO TABS: 5.0000 mg | ORAL_TABLET | Freq: Every day | ORAL | 1 refills | Status: DC

## 2019-09-10 NOTE — Telephone Encounter (Signed)
Last seen for DOT physical 03/19/2019 by me.  BP 140/80 HR 64  BMI 39.  Electronic Rx bridge refill by PA Levada Schilling 09/10/2019 as labs due Feb 2021 fasting exec panel  Last physical 07/16/2018 with Dr Dorris Fetch.

## 2019-10-01 ENCOUNTER — Other Ambulatory Visit: Payer: Self-pay

## 2019-10-06 NOTE — Progress Notes (Deleted)
Needs to schedule appt with provider to complete physical.  AMD 

## 2019-10-08 ENCOUNTER — Ambulatory Visit: Payer: Self-pay

## 2019-10-08 ENCOUNTER — Other Ambulatory Visit: Payer: Self-pay

## 2019-10-08 DIAGNOSIS — Z0000 Encounter for general adult medical examination without abnormal findings: Secondary | ICD-10-CM

## 2019-10-08 LAB — POCT URINALYSIS DIPSTICK
Bilirubin, UA: NEGATIVE
Blood, UA: NEGATIVE
Glucose, UA: NEGATIVE
Ketones, UA: NEGATIVE
Leukocytes, UA: NEGATIVE
Nitrite, UA: NEGATIVE
Protein, UA: NEGATIVE
Spec Grav, UA: 1.015 (ref 1.010–1.025)
Urobilinogen, UA: 0.2 E.U./dL
pH, UA: 8 (ref 5.0–8.0)

## 2019-10-08 NOTE — Progress Notes (Signed)
Scheduled to complete physical 10/15/19 with Ron Smith, PA-C.  AMD 

## 2019-10-09 LAB — CMP12+LP+TP+TSH+6AC+PSA+CBC…
ALT: 33 IU/L (ref 0–44)
AST: 24 IU/L (ref 0–40)
Albumin/Globulin Ratio: 1.5 (ref 1.2–2.2)
Albumin: 4.5 g/dL (ref 4.0–5.0)
Alkaline Phosphatase: 81 IU/L (ref 39–117)
BUN/Creatinine Ratio: 21 — ABNORMAL HIGH (ref 9–20)
BUN: 17 mg/dL (ref 6–24)
Basophils Absolute: 0 10*3/uL (ref 0.0–0.2)
Basos: 0 %
Bilirubin Total: 0.5 mg/dL (ref 0.0–1.2)
Calcium: 9.6 mg/dL (ref 8.7–10.2)
Chloride: 105 mmol/L (ref 96–106)
Chol/HDL Ratio: 3.7 ratio (ref 0.0–5.0)
Cholesterol, Total: 166 mg/dL (ref 100–199)
Creatinine, Ser: 0.8 mg/dL (ref 0.76–1.27)
EOS (ABSOLUTE): 0.1 10*3/uL (ref 0.0–0.4)
Eos: 1 %
Estimated CHD Risk: 0.6 times avg. (ref 0.0–1.0)
Free Thyroxine Index: 2.7 (ref 1.2–4.9)
GFR calc Af Amer: 125 mL/min/{1.73_m2} (ref >59)
GFR calc non Af Amer: 108 mL/min/{1.73_m2} (ref >59)
GGT: 25 IU/L (ref 0–65)
Globulin, Total: 3 g/dL (ref 1.5–4.5)
Glucose: 94 mg/dL (ref 65–99)
HDL: 45 mg/dL (ref >39)
Hematocrit: 46 % (ref 37.5–51.0)
Hemoglobin: 14.6 g/dL (ref 13.0–17.7)
Immature Grans (Abs): 0 10*3/uL (ref 0.0–0.1)
Immature Granulocytes: 0 %
Iron: 86 ug/dL (ref 38–169)
LDH: 237 IU/L — ABNORMAL HIGH (ref 121–224)
LDL Chol Calc (NIH): 104 mg/dL — ABNORMAL HIGH (ref 0–99)
Lymphocytes Absolute: 2.9 10*3/uL (ref 0.7–3.1)
Lymphs: 41 %
MCH: 25.5 pg — ABNORMAL LOW (ref 26.6–33.0)
MCHC: 31.7 g/dL (ref 31.5–35.7)
MCV: 80 fL (ref 79–97)
Monocytes Absolute: 0.6 10*3/uL (ref 0.1–0.9)
Monocytes: 8 %
Neutrophils Absolute: 3.5 10*3/uL (ref 1.4–7.0)
Neutrophils: 50 %
Phosphorus: 2.8 mg/dL (ref 2.8–4.1)
Platelets: 212 10*3/uL (ref 150–450)
Potassium: 4.5 mmol/L (ref 3.5–5.2)
Prostate Specific Ag, Serum: 0.8 ng/mL (ref 0.0–4.0)
RBC: 5.73 x10E6/uL (ref 4.14–5.80)
RDW: 13.6 % (ref 11.6–15.4)
Sodium: 141 mmol/L (ref 134–144)
T3 Uptake Ratio: 31 % (ref 24–39)
T4, Total: 8.7 ug/dL (ref 4.5–12.0)
TSH: 3.62 u[IU]/mL (ref 0.450–4.500)
Total Protein: 7.5 g/dL (ref 6.0–8.5)
Triglycerides: 91 mg/dL (ref 0–149)
Uric Acid: 5.3 mg/dL (ref 3.8–8.4)
VLDL Cholesterol Cal: 17 mg/dL (ref 5–40)
WBC: 7.1 10*3/uL (ref 3.4–10.8)

## 2019-10-15 ENCOUNTER — Encounter: Payer: Self-pay | Admitting: Physician Assistant

## 2019-10-15 ENCOUNTER — Ambulatory Visit: Payer: Self-pay | Admitting: Physician Assistant

## 2019-10-15 ENCOUNTER — Other Ambulatory Visit: Payer: Self-pay

## 2019-10-15 VITALS — BP 142/91 | HR 64 | Temp 97.6°F | Resp 16 | Ht 69.0 in | Wt 271.0 lb

## 2019-10-15 DIAGNOSIS — I10 Essential (primary) hypertension: Secondary | ICD-10-CM

## 2019-10-15 DIAGNOSIS — Z0000 Encounter for general adult medical examination without abnormal findings: Secondary | ICD-10-CM

## 2019-10-15 MED ORDER — AMLODIPINE BESYLATE 5 MG PO TABS: 5.0000 mg | ORAL_TABLET | Freq: Every day | ORAL | 3 refills | Status: DC

## 2019-10-15 NOTE — Progress Notes (Signed)
   Subjective: Annual physical exam    Patient ID: Johnathan Owens, male    DOB: [birth year], 44 y.o.   MRN: [id number]  HPI Patient presents annual physical exam.  Patient reports no concerns or complaints.   Review of Systems Hypertension    Objective:   Physical Exam No acute distress.  Obesity.  HEENT unremarkable.  Neck is supple for adenopathy or bruits.  Lungs are clear to auscultation.  Heart regular rate and rhythm.  Abdomen negative HSM, normoactive bowel sounds, soft, and nontender to palpation.  No obvious deformity to the upper or lower extremities.  Patient full equal range of motion upper lower extremities.  No obvious deformity to the cervical lumbar spine.  Patient full equal range of motion cervical lumbar spine.  Cranial nerves II through XII grossly intact.       Assessment & Plan: Well exam.  Discussed lab results with patient.  Patient Norvasc was refilled.  Patient advised follow-up as needed.

## 2019-11-05 DIAGNOSIS — G4719 Other hypersomnia: Secondary | ICD-10-CM | POA: Diagnosis not present

## 2019-12-04 DIAGNOSIS — G4733 Obstructive sleep apnea (adult) (pediatric): Secondary | ICD-10-CM | POA: Diagnosis not present

## 2019-12-17 DIAGNOSIS — I10 Essential (primary) hypertension: Secondary | ICD-10-CM | POA: Diagnosis not present

## 2019-12-17 DIAGNOSIS — G4733 Obstructive sleep apnea (adult) (pediatric): Secondary | ICD-10-CM | POA: Diagnosis not present

## 2019-12-31 DIAGNOSIS — I10 Essential (primary) hypertension: Secondary | ICD-10-CM | POA: Diagnosis not present

## 2019-12-31 DIAGNOSIS — G4733 Obstructive sleep apnea (adult) (pediatric): Secondary | ICD-10-CM | POA: Diagnosis not present

## 2020-01-28 DIAGNOSIS — G4733 Obstructive sleep apnea (adult) (pediatric): Secondary | ICD-10-CM | POA: Diagnosis not present

## 2020-01-31 DIAGNOSIS — G4733 Obstructive sleep apnea (adult) (pediatric): Secondary | ICD-10-CM | POA: Diagnosis not present

## 2020-03-01 DIAGNOSIS — G4733 Obstructive sleep apnea (adult) (pediatric): Secondary | ICD-10-CM | POA: Diagnosis not present

## 2020-03-03 DIAGNOSIS — G4733 Obstructive sleep apnea (adult) (pediatric): Secondary | ICD-10-CM | POA: Diagnosis not present

## 2020-03-24 DIAGNOSIS — G4733 Obstructive sleep apnea (adult) (pediatric): Secondary | ICD-10-CM | POA: Diagnosis not present

## 2020-03-24 DIAGNOSIS — I10 Essential (primary) hypertension: Secondary | ICD-10-CM | POA: Diagnosis not present

## 2020-03-24 DIAGNOSIS — R7303 Prediabetes: Secondary | ICD-10-CM | POA: Diagnosis not present

## 2020-03-24 DIAGNOSIS — Z23 Encounter for immunization: Secondary | ICD-10-CM | POA: Diagnosis not present

## 2020-04-01 DIAGNOSIS — G4733 Obstructive sleep apnea (adult) (pediatric): Secondary | ICD-10-CM | POA: Diagnosis not present

## 2020-04-02 DIAGNOSIS — G4733 Obstructive sleep apnea (adult) (pediatric): Secondary | ICD-10-CM | POA: Diagnosis not present

## 2020-05-01 DIAGNOSIS — G4733 Obstructive sleep apnea (adult) (pediatric): Secondary | ICD-10-CM | POA: Diagnosis not present

## 2020-05-02 DIAGNOSIS — G4733 Obstructive sleep apnea (adult) (pediatric): Secondary | ICD-10-CM | POA: Diagnosis not present

## 2020-05-10 DIAGNOSIS — J0140 Acute pansinusitis, unspecified: Secondary | ICD-10-CM | POA: Diagnosis not present

## 2020-05-10 DIAGNOSIS — I10 Essential (primary) hypertension: Secondary | ICD-10-CM | POA: Diagnosis not present

## 2020-06-01 DIAGNOSIS — G4733 Obstructive sleep apnea (adult) (pediatric): Secondary | ICD-10-CM | POA: Diagnosis not present

## 2020-06-02 DIAGNOSIS — G4733 Obstructive sleep apnea (adult) (pediatric): Secondary | ICD-10-CM | POA: Diagnosis not present

## 2020-06-08 DIAGNOSIS — Z20822 Contact with and (suspected) exposure to covid-19: Secondary | ICD-10-CM | POA: Diagnosis not present

## 2020-07-02 DIAGNOSIS — G4733 Obstructive sleep apnea (adult) (pediatric): Secondary | ICD-10-CM | POA: Diagnosis not present

## 2020-07-08 DIAGNOSIS — Z20828 Contact with and (suspected) exposure to other viral communicable diseases: Secondary | ICD-10-CM | POA: Diagnosis not present

## 2020-07-08 DIAGNOSIS — Z7189 Other specified counseling: Secondary | ICD-10-CM | POA: Diagnosis not present

## 2020-07-08 DIAGNOSIS — U071 COVID-19: Secondary | ICD-10-CM | POA: Diagnosis not present

## 2020-07-16 DIAGNOSIS — G4733 Obstructive sleep apnea (adult) (pediatric): Secondary | ICD-10-CM | POA: Diagnosis not present

## 2020-07-30 DIAGNOSIS — G4733 Obstructive sleep apnea (adult) (pediatric): Secondary | ICD-10-CM | POA: Diagnosis not present

## 2020-08-13 DIAGNOSIS — G4733 Obstructive sleep apnea (adult) (pediatric): Secondary | ICD-10-CM | POA: Diagnosis not present

## 2020-08-17 DIAGNOSIS — M25562 Pain in left knee: Secondary | ICD-10-CM | POA: Insufficient documentation

## 2020-08-17 DIAGNOSIS — M25561 Pain in right knee: Secondary | ICD-10-CM | POA: Insufficient documentation

## 2020-08-18 DIAGNOSIS — M25561 Pain in right knee: Secondary | ICD-10-CM | POA: Diagnosis not present

## 2020-08-18 DIAGNOSIS — M25562 Pain in left knee: Secondary | ICD-10-CM | POA: Diagnosis not present

## 2020-08-25 DIAGNOSIS — Z0000 Encounter for general adult medical examination without abnormal findings: Secondary | ICD-10-CM | POA: Diagnosis not present

## 2020-08-25 DIAGNOSIS — R7303 Prediabetes: Secondary | ICD-10-CM | POA: Diagnosis not present

## 2020-08-25 DIAGNOSIS — G4733 Obstructive sleep apnea (adult) (pediatric): Secondary | ICD-10-CM | POA: Diagnosis not present

## 2020-08-25 DIAGNOSIS — Z13220 Encounter for screening for lipoid disorders: Secondary | ICD-10-CM | POA: Diagnosis not present

## 2020-08-25 DIAGNOSIS — M179 Osteoarthritis of knee, unspecified: Secondary | ICD-10-CM | POA: Diagnosis not present

## 2020-08-25 DIAGNOSIS — Z125 Encounter for screening for malignant neoplasm of prostate: Secondary | ICD-10-CM | POA: Diagnosis not present

## 2020-08-25 DIAGNOSIS — Z1389 Encounter for screening for other disorder: Secondary | ICD-10-CM | POA: Diagnosis not present

## 2020-08-25 DIAGNOSIS — I10 Essential (primary) hypertension: Secondary | ICD-10-CM | POA: Diagnosis not present

## 2020-08-30 DIAGNOSIS — G4733 Obstructive sleep apnea (adult) (pediatric): Secondary | ICD-10-CM | POA: Diagnosis not present

## 2020-09-01 DIAGNOSIS — M25562 Pain in left knee: Secondary | ICD-10-CM | POA: Diagnosis not present

## 2020-09-01 DIAGNOSIS — M25561 Pain in right knee: Secondary | ICD-10-CM | POA: Diagnosis not present

## 2020-09-13 DIAGNOSIS — G4733 Obstructive sleep apnea (adult) (pediatric): Secondary | ICD-10-CM | POA: Diagnosis not present

## 2020-09-29 DIAGNOSIS — G4733 Obstructive sleep apnea (adult) (pediatric): Secondary | ICD-10-CM | POA: Diagnosis not present

## 2020-11-16 NOTE — Progress Notes (Signed)
Pt scheduled to complete annual physical 11/24/20 with adam scarboro,NP

## 2020-11-17 ENCOUNTER — Ambulatory Visit: Payer: Self-pay

## 2020-11-17 ENCOUNTER — Other Ambulatory Visit: Payer: Self-pay

## 2020-11-17 DIAGNOSIS — Z0000 Encounter for general adult medical examination without abnormal findings: Secondary | ICD-10-CM

## 2020-11-17 LAB — POCT URINALYSIS DIPSTICK
Appearance: NORMAL
Bilirubin, UA: NEGATIVE
Blood, UA: POSITIVE
Glucose, UA: NEGATIVE
Ketones, UA: NEGATIVE
Leukocytes, UA: NEGATIVE
Nitrite, UA: NEGATIVE
Protein, UA: NEGATIVE
Spec Grav, UA: >=1.03 — AB (ref 1.010–1.025)
Urobilinogen, UA: 0.2 E.U./dL
pH, UA: 5.5 (ref 5.0–8.0)

## 2020-11-18 DIAGNOSIS — G4733 Obstructive sleep apnea (adult) (pediatric): Secondary | ICD-10-CM | POA: Diagnosis not present

## 2020-11-18 LAB — CMP12+LP+TP+TSH+6AC+PSA+CBC…
ALT: 26 IU/L (ref 0–44)
AST: 19 IU/L (ref 0–40)
Albumin/Globulin Ratio: 2 (ref 1.2–2.2)
Albumin: 4.6 g/dL (ref 4.0–5.0)
Alkaline Phosphatase: 65 IU/L (ref 44–121)
BUN/Creatinine Ratio: 27 — ABNORMAL HIGH (ref 9–20)
BUN: 21 mg/dL (ref 6–24)
Basophils Absolute: 0 10*3/uL (ref 0.0–0.2)
Basos: 0 %
Bilirubin Total: 0.3 mg/dL (ref 0.0–1.2)
Calcium: 9.2 mg/dL (ref 8.7–10.2)
Chloride: 105 mmol/L (ref 96–106)
Chol/HDL Ratio: 3.3 ratio (ref 0.0–5.0)
Cholesterol, Total: 173 mg/dL (ref 100–199)
Creatinine, Ser: 0.78 mg/dL (ref 0.76–1.27)
EOS (ABSOLUTE): 0.1 10*3/uL (ref 0.0–0.4)
Eos: 1 %
Estimated CHD Risk: <0.5 times avg. (ref 0.0–1.0)
Free Thyroxine Index: 2.9 (ref 1.2–4.9)
GGT: 23 IU/L (ref 0–65)
Globulin, Total: 2.3 g/dL (ref 1.5–4.5)
Glucose: 156 mg/dL — ABNORMAL HIGH (ref 65–99)
HDL: 52 mg/dL (ref >39)
Hematocrit: 46.3 % (ref 37.5–51.0)
Hemoglobin: 14.7 g/dL (ref 13.0–17.7)
Immature Grans (Abs): 0 10*3/uL (ref 0.0–0.1)
Immature Granulocytes: 0 %
Iron: 52 ug/dL (ref 38–169)
LDH: 218 IU/L (ref 121–224)
LDL Chol Calc (NIH): 110 mg/dL — ABNORMAL HIGH (ref 0–99)
Lymphocytes Absolute: 2.1 10*3/uL (ref 0.7–3.1)
Lymphs: 36 %
MCH: 25.8 pg — ABNORMAL LOW (ref 26.6–33.0)
MCHC: 31.7 g/dL (ref 31.5–35.7)
MCV: 81 fL (ref 79–97)
Monocytes Absolute: 0.4 10*3/uL (ref 0.1–0.9)
Monocytes: 6 %
Neutrophils Absolute: 3.3 10*3/uL (ref 1.4–7.0)
Neutrophils: 57 %
Phosphorus: 2.7 mg/dL — ABNORMAL LOW (ref 2.8–4.1)
Platelets: 233 10*3/uL (ref 150–450)
Potassium: 4.2 mmol/L (ref 3.5–5.2)
Prostate Specific Ag, Serum: 0.9 ng/mL (ref 0.0–4.0)
RBC: 5.7 x10E6/uL (ref 4.14–5.80)
RDW: 13.4 % (ref 11.6–15.4)
Sodium: 142 mmol/L (ref 134–144)
T3 Uptake Ratio: 35 % (ref 24–39)
T4, Total: 8.2 ug/dL (ref 4.5–12.0)
TSH: 2.36 u[IU]/mL (ref 0.450–4.500)
Total Protein: 6.9 g/dL (ref 6.0–8.5)
Triglycerides: 56 mg/dL (ref 0–149)
Uric Acid: 5.8 mg/dL (ref 3.8–8.4)
VLDL Cholesterol Cal: 11 mg/dL (ref 5–40)
WBC: 5.8 10*3/uL (ref 3.4–10.8)
eGFR: 111 mL/min/{1.73_m2} (ref >59)

## 2020-11-24 ENCOUNTER — Ambulatory Visit: Payer: Self-pay | Admitting: Adult Health

## 2020-11-24 ENCOUNTER — Encounter: Payer: Self-pay | Admitting: Adult Health

## 2020-11-24 ENCOUNTER — Other Ambulatory Visit: Payer: Self-pay

## 2020-11-24 VITALS — BP 142/95 | HR 73 | Temp 97.6°F | Resp 14 | Ht 69.0 in | Wt 253.0 lb

## 2020-11-24 DIAGNOSIS — M179 Osteoarthritis of knee, unspecified: Secondary | ICD-10-CM | POA: Insufficient documentation

## 2020-11-24 DIAGNOSIS — M1710 Unilateral primary osteoarthritis, unspecified knee: Secondary | ICD-10-CM | POA: Insufficient documentation

## 2020-11-24 DIAGNOSIS — Z6837 Body mass index (BMI) 37.0-37.9, adult: Secondary | ICD-10-CM

## 2020-11-24 DIAGNOSIS — Z0000 Encounter for general adult medical examination without abnormal findings: Secondary | ICD-10-CM

## 2020-11-24 DIAGNOSIS — I10 Essential (primary) hypertension: Secondary | ICD-10-CM | POA: Insufficient documentation

## 2020-11-24 DIAGNOSIS — M5430 Sciatica, unspecified side: Secondary | ICD-10-CM | POA: Insufficient documentation

## 2020-11-24 DIAGNOSIS — G4733 Obstructive sleep apnea (adult) (pediatric): Secondary | ICD-10-CM | POA: Insufficient documentation

## 2020-11-24 DIAGNOSIS — R400 Somnolence: Secondary | ICD-10-CM | POA: Insufficient documentation

## 2020-11-24 NOTE — Progress Notes (Signed)
Reviewed CDC recommendations for importance of HIV/ Hep C screening once in lifetime. Patient has declined HIV / Hep C screenings today and will let us know if they should change their mind in the future.

## 2020-11-24 NOTE — Progress Notes (Signed)
[facility] [address] [place], [place] [address]  Internal MEDICINE  Office Visit Note  Patient Name: Johnathan Owens  [id number]  [id number]  Date of Service: 11/24/2020  No chief complaint on file.    HPI Pt is here for routine health maintenance examination.  He is a well appearing 45 yo male.  He has worked in [employer] for 6 years.  He is married for the last 23 years, and has 3 sons, ages 51,10 and 28.  His history and labs are reviewed.  Denies tobacco or illicit drug use.  He drinks alcohol occasionally. He is increasing his exercise, and is currently on Keto diet to help lose weight.   Current Medication: Outpatient Encounter Medications as of 11/24/2020  Medication Sig   amLODipine-benazepril (LOTREL) 5-10 MG capsule Take 1 capsule by mouth daily.   Loratadine 10 MG CAPS Take 10 mg by mouth daily.   triamcinolone (NASACORT) 55 MCG/ACT AERO nasal inhaler two sprays by Both Nostrils route daily.   [DISCONTINUED] amLODipine (NORVASC) 5 MG tablet Take 1 tablet (5 mg total) by mouth daily.   No facility-administered encounter medications on file as of 11/24/2020.    Surgical History: Past Surgical History:  Procedure Laterality Date   ANKLE SURGERY Right    While in High School    Medical History: Past Medical History:  Diagnosis Date   Hypertension    Obesity    Serum sodium elevated     Family History: Family History  Problem Relation Age of Onset   Lung cancer Mother    Hypertension Father    Glaucoma Father     Social History: Social History   Socioeconomic History   Marital status: Married    Spouse name: Not on file   Number of children: Not on file   Years of education: Not on file   Highest education level: Not on file  Occupational History   Not on file  Tobacco Use   Smoking status: Never   Smokeless tobacco: Never  Substance and Sexual Activity   Alcohol use: Yes   Drug use: No   Sexual activity: Not on file   Other Topics Concern   Not on file  Social History Narrative   Not on file   Social Determinants of Health   Financial Resource Strain: Not on file  Food Insecurity: Not on file  Transportation Needs: Not on file  Physical Activity: Not on file  Stress: Not on file  Social Connections: Not on file      Review of Systems  Constitutional:  Negative for activity change, appetite change and fatigue.  HENT:  Negative for congestion, sinus pain, trouble swallowing and voice change.   Eyes:  Negative for pain, discharge and visual disturbance.  Respiratory:  Negative for cough, chest tightness and shortness of breath.   Cardiovascular:  Negative for chest pain and leg swelling.  Gastrointestinal:  Negative for abdominal distention, abdominal pain, constipation and diarrhea.  Genitourinary:  Negative for difficulty urinating, dysuria, scrotal swelling and testicular pain.  Musculoskeletal:  Negative for arthralgias, back pain and neck pain.  Skin:  Negative for color change.  Neurological:  Negative for dizziness, weakness and headaches.  Hematological:  Negative for adenopathy.  Psychiatric/Behavioral:  Negative for agitation, confusion and suicidal ideas.     Vital Signs: BP (!) 142/95   Pulse 73   Temp 97.6 F (36.4 C)   Resp 14   Ht '5\' 9"'  (1.753 m)  Wt 253 lb (114.8 kg)   SpO2 95%   BMI 37.36 kg/m    Physical Exam Constitutional:      Appearance: Normal appearance.  HENT:     Head: Normocephalic.     Right Ear: Tympanic membrane normal.     Left Ear: Tympanic membrane normal.     Nose: Nose normal.     Mouth/Throat:     Mouth: Mucous membranes are moist.     Pharynx: No oropharyngeal exudate or posterior oropharyngeal erythema.  Eyes:     General:        Right eye: No discharge.        Left eye: No discharge.     Extraocular Movements: Extraocular movements intact.     Pupils: Pupils are equal, round, and reactive to light.  Cardiovascular:     Rate and  Rhythm: Normal rate and regular rhythm.     Pulses: Normal pulses.     Heart sounds: Normal heart sounds. No murmur heard. Pulmonary:     Effort: Pulmonary effort is normal. No respiratory distress.     Breath sounds: Normal breath sounds. No wheezing or rhonchi.  Abdominal:     General: Abdomen is flat. Bowel sounds are normal. There is no distension.     Palpations: There is no mass.     Tenderness: There is no abdominal tenderness. There is no guarding.     Hernia: No hernia is present.  Musculoskeletal:        General: No swelling or deformity. Normal range of motion.     Cervical back: Normal range of motion.  Skin:    General: Skin is warm and dry.     Capillary Refill: Capillary refill takes less than 2 seconds.  Neurological:     General: No focal deficit present.     Mental Status: He is alert.     Cranial Nerves: No cranial nerve deficit.     Gait: Gait normal.  Psychiatric:        Mood and Affect: Mood normal.        Behavior: Behavior normal.        Judgment: Judgment normal.     LABS: Recent Results (from the past 2160 hour(s))  CMP12+LP+TP+TSH+6AC+PSA+CBC.     Status: Abnormal   Collection Time: 11/17/20 12:02 PM  Result Value Ref Range   Glucose 156 (H) 65 - 99 mg/dL   Uric Acid 5.8 3.8 - 8.4 mg/dL    Comment:            Therapeutic target for gout patients: <6.0   BUN 21 6 - 24 mg/dL   Creatinine, Ser 0.78 0.76 - 1.27 mg/dL   eGFR 111 >59 mL/min/1.73   BUN/Creatinine Ratio 27 (H) 9 - 20   Sodium 142 134 - 144 mmol/L   Potassium 4.2 3.5 - 5.2 mmol/L   Chloride 105 96 - 106 mmol/L   Calcium 9.2 8.7 - 10.2 mg/dL   Phosphorus 2.7 (L) 2.8 - 4.1 mg/dL   Total Protein 6.9 6.0 - 8.5 g/dL   Albumin 4.6 4.0 - 5.0 g/dL   Globulin, Total 2.3 1.5 - 4.5 g/dL   Albumin/Globulin Ratio 2.0 1.2 - 2.2   Bilirubin Total 0.3 0.0 - 1.2 mg/dL   Alkaline Phosphatase 65 44 - 121 IU/L   LDH 218 121 - 224 IU/L   AST 19 0 - 40 IU/L   ALT 26 0 - 44 IU/L   GGT 23 0 - 65 IU/L  Iron 52 38 - 169 ug/dL   Cholesterol, Total 173 100 - 199 mg/dL   Triglycerides 56 0 - 149 mg/dL   HDL 52 >39 mg/dL   VLDL Cholesterol Cal 11 5 - 40 mg/dL   LDL Chol Calc (NIH) 110 (H) 0 - 99 mg/dL   Chol/HDL Ratio 3.3 0.0 - 5.0 ratio    Comment:                                   T. Chol/HDL Ratio                                             Men  Women                               1/2 Avg.Risk  3.4    3.3                                   Avg.Risk  5.0    4.4                                2X Avg.Risk  9.6    7.1                                3X Avg.Risk 23.4   11.0    Estimated CHD Risk  < 0.5 0.0 - 1.0 times avg.    Comment: The CHD Risk is based on the T. Chol/HDL ratio. Other factors affect CHD Risk such as hypertension, smoking, diabetes, severe obesity, and family history of premature CHD.    TSH 2.360 0.450 - 4.500 uIU/mL   T4, Total 8.2 4.5 - 12.0 ug/dL   T3 Uptake Ratio 35 24 - 39 %   Free Thyroxine Index 2.9 1.2 - 4.9   Prostate Specific Ag, Serum 0.9 0.0 - 4.0 ng/mL    Comment: Roche ECLIA methodology. According to the American Urological Association, Serum PSA should decrease and remain at undetectable levels after radical prostatectomy. The AUA defines biochemical recurrence as an initial PSA value 0.2 ng/mL or greater followed by a subsequent confirmatory PSA value 0.2 ng/mL or greater. Values obtained with different assay methods or kits cannot be used interchangeably. Results cannot be interpreted as absolute evidence of the presence or absence of malignant disease.    WBC 5.8 3.4 - 10.8 x10E3/uL   RBC 5.70 4.14 - 5.80 x10E6/uL   Hemoglobin 14.7 13.0 - 17.7 g/dL   Hematocrit 46.3 37.5 - 51.0 %   MCV 81 79 - 97 fL   MCH 25.8 (L) 26.6 - 33.0 pg   MCHC 31.7 31.5 - 35.7 g/dL   RDW 13.4 11.6 - 15.4 %   Platelets 233 150 - 450 x10E3/uL   Neutrophils 57 Not Estab. %   Lymphs 36 Not Estab. %   Monocytes 6 Not Estab. %   Eos 1 Not Estab. %   Basos 0 Not  Estab. %   Neutrophils Absolute 3.3 1.4 - 7.0 x10E3/uL   Lymphocytes Absolute 2.1 0.7 -  3.1 x10E3/uL   Monocytes Absolute 0.4 0.1 - 0.9 x10E3/uL   EOS (ABSOLUTE) 0.1 0.0 - 0.4 x10E3/uL   Basophils Absolute 0.0 0.0 - 0.2 x10E3/uL   Immature Granulocytes 0 Not Estab. %   Immature Grans (Abs) 0.0 0.0 - 0.1 x10E3/uL  POCT urinalysis dipstick     Status: Abnormal   Collection Time: 11/17/20 12:05 PM  Result Value Ref Range   Color, UA Yellow    Clarity, UA Clear    Glucose, UA Negative Negative   Bilirubin, UA Negative    Ketones, UA Negative    Spec Grav, UA >=1.030 (A) 1.010 - 1.025   Blood, UA Positive     Comment: +10 Ery / uL   pH, UA 5.5 5.0 - 8.0   Protein, UA Negative Negative   Urobilinogen, UA 0.2 0.2 or 1.0 E.U./dL   Nitrite, UA Negative    Leukocytes, UA Negative Negative   Appearance Normal    Odor None     Assessment/Plan: 1. Routine adult health maintenance Up to date on PHM  2. Essential hypertension, benign Continue amlodipine, Bp's at home are WNL. Continue  current management.   3. BMI 37.0-37.9, adult Obesity Counseling: Risk Assessment: An assessment of behavioral risk factors was made today and includes lack of exercise sedentary lifestyle, lack of portion control and poor dietary habits.  Risk Modification Advice: She was counseled on portion control guidelines. Restricting daily caloric intake to 1800. The detrimental long term effects of obesity on her health and ongoing poor compliance was also discussed with the patient.      General Counseling: deadrian toya understanding of the findings of todays visit and agrees with plan of treatment. I have discussed any further diagnostic evaluation that may be needed or ordered today. We also reviewed his medications today. he has been encouraged to call the office with any questions or concerns that should arise related to todays visit.    No orders of the defined types were placed in this  encounter.   No orders of the defined types were placed in this encounter.   Total time spent:40 Minutes  Time spent includes review of chart, medications, test results, and follow up plan with the patient.    Kendell Bane AGNP-C Nurse Practitioner

## 2020-12-15 DIAGNOSIS — Z0100 Encounter for examination of eyes and vision without abnormal findings: Secondary | ICD-10-CM | POA: Diagnosis not present

## 2020-12-18 DIAGNOSIS — G4733 Obstructive sleep apnea (adult) (pediatric): Secondary | ICD-10-CM | POA: Diagnosis not present

## 2021-01-18 DIAGNOSIS — G4733 Obstructive sleep apnea (adult) (pediatric): Secondary | ICD-10-CM | POA: Diagnosis not present

## 2021-02-09 ENCOUNTER — Other Ambulatory Visit: Payer: Self-pay

## 2021-02-09 ENCOUNTER — Ambulatory Visit: Payer: Self-pay | Admitting: Physician Assistant

## 2021-02-09 ENCOUNTER — Encounter: Payer: Self-pay | Admitting: Physician Assistant

## 2021-02-09 VITALS — BP 136/87 | HR 71 | Temp 97.6°F | Resp 14 | Ht 69.0 in | Wt 245.0 lb

## 2021-02-09 DIAGNOSIS — Z024 Encounter for examination for driving license: Secondary | ICD-10-CM

## 2021-02-09 LAB — POCT URINALYSIS DIPSTICK
Bilirubin, UA: NEGATIVE
Blood, UA: NEGATIVE
Glucose, UA: NEGATIVE
Leukocytes, UA: NEGATIVE
Nitrite, UA: NEGATIVE
Protein, UA: NEGATIVE
Spec Grav, UA: 1.02 (ref 1.010–1.025)
Urobilinogen, UA: 0.2 E.U./dL
pH, UA: 6.5 (ref 5.0–8.0)

## 2021-02-09 NOTE — Progress Notes (Signed)
   Subjective: DOT physical    Patient ID: Johnathan Owens, male    DOB: 07-Apr-1976, 45 y.o.   MRN: [id number]  HPI Patient presents for DOT physical.  Voices no concerns or complaints.   Review of Systems Hypertension, sleep apnea, and sciatica.    Objective:   Physical Exam No acute distress.  Temperature 97.6, pulse 71, respiration 14, BP is 136/87, and patient 97% O2 sat on room air.  Patient weighs 245 pounds and BMI is 36.18. HEENT is unremarkable.  Neck is supple without adenopathy or bruits.  Lungs are clear to auscultation.  Heart regular rate and rhythm. Abdomen is distended secondary to body habitus, normoactive bowel sounds, soft, nontender to palpation. No obvious deformity to the upper or lower extremities.  Patient has full and equal range of motion upper and lower extremities. No obvious cervical or lumbar spine deformity.  Patient has full and equal range of motion of the cervical and lumbar spine. Skin: Patient has multiple nevi. Cranial nerves II through XII are grossly intact.  DTRs 2+ without clonus.      Assessment & Plan: Well exam.   Patient presents for DOT exam.  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  Advised continue previous medications and follow-up as necessary.

## 2021-02-16 DIAGNOSIS — I10 Essential (primary) hypertension: Secondary | ICD-10-CM | POA: Diagnosis not present

## 2021-02-16 DIAGNOSIS — G4733 Obstructive sleep apnea (adult) (pediatric): Secondary | ICD-10-CM | POA: Diagnosis not present

## 2021-02-16 DIAGNOSIS — M179 Osteoarthritis of knee, unspecified: Secondary | ICD-10-CM | POA: Diagnosis not present

## 2021-02-22 DIAGNOSIS — M5431 Sciatica, right side: Secondary | ICD-10-CM | POA: Diagnosis not present

## 2021-02-22 DIAGNOSIS — I10 Essential (primary) hypertension: Secondary | ICD-10-CM | POA: Diagnosis not present

## 2021-03-02 DIAGNOSIS — M5431 Sciatica, right side: Secondary | ICD-10-CM | POA: Diagnosis not present

## 2021-03-09 DIAGNOSIS — M5431 Sciatica, right side: Secondary | ICD-10-CM | POA: Diagnosis not present

## 2021-03-16 DIAGNOSIS — M5431 Sciatica, right side: Secondary | ICD-10-CM | POA: Diagnosis not present

## 2021-03-18 ENCOUNTER — Other Ambulatory Visit: Payer: Self-pay

## 2021-03-18 DIAGNOSIS — Z0283 Encounter for blood-alcohol and blood-drug test: Secondary | ICD-10-CM

## 2021-03-18 DIAGNOSIS — G4733 Obstructive sleep apnea (adult) (pediatric): Secondary | ICD-10-CM | POA: Diagnosis not present

## 2021-03-18 NOTE — Progress Notes (Signed)
Presents to [facility] for Random DOT Drug Screen.  [facility] Acct #:  [id number] [facility] Specimen #:  [id number]  AMD

## 2021-03-23 DIAGNOSIS — M5431 Sciatica, right side: Secondary | ICD-10-CM | POA: Diagnosis not present

## 2021-03-25 ENCOUNTER — Other Ambulatory Visit: Payer: Self-pay
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
## 2021-03-30 DIAGNOSIS — M5431 Sciatica, right side: Secondary | ICD-10-CM | POA: Diagnosis not present

## 2021-04-06 DIAGNOSIS — G4733 Obstructive sleep apnea (adult) (pediatric): Secondary | ICD-10-CM | POA: Diagnosis not present

## 2021-04-18 DIAGNOSIS — G4733 Obstructive sleep apnea (adult) (pediatric): Secondary | ICD-10-CM | POA: Diagnosis not present

## 2021-06-01 DIAGNOSIS — Z1211 Encounter for screening for malignant neoplasm of colon: Secondary | ICD-10-CM | POA: Diagnosis not present

## 2021-06-01 DIAGNOSIS — K5730 Diverticulosis of large intestine without perforation or abscess without bleeding: Secondary | ICD-10-CM | POA: Diagnosis not present

## 2021-08-25 DIAGNOSIS — G4733 Obstructive sleep apnea (adult) (pediatric): Secondary | ICD-10-CM | POA: Diagnosis not present

## 2021-08-31 DIAGNOSIS — I10 Essential (primary) hypertension: Secondary | ICD-10-CM | POA: Diagnosis not present

## 2021-08-31 DIAGNOSIS — Z1389 Encounter for screening for other disorder: Secondary | ICD-10-CM | POA: Diagnosis not present

## 2021-08-31 DIAGNOSIS — Z13220 Encounter for screening for lipoid disorders: Secondary | ICD-10-CM | POA: Diagnosis not present

## 2021-08-31 DIAGNOSIS — Z6837 Body mass index (BMI) 37.0-37.9, adult: Secondary | ICD-10-CM | POA: Diagnosis not present

## 2021-08-31 DIAGNOSIS — G4733 Obstructive sleep apnea (adult) (pediatric): Secondary | ICD-10-CM | POA: Diagnosis not present

## 2021-08-31 DIAGNOSIS — R7303 Prediabetes: Secondary | ICD-10-CM | POA: Diagnosis not present

## 2021-08-31 DIAGNOSIS — Z0000 Encounter for general adult medical examination without abnormal findings: Secondary | ICD-10-CM | POA: Diagnosis not present

## 2021-08-31 DIAGNOSIS — Z125 Encounter for screening for malignant neoplasm of prostate: Secondary | ICD-10-CM | POA: Diagnosis not present

## 2021-09-25 DIAGNOSIS — G4733 Obstructive sleep apnea (adult) (pediatric): Secondary | ICD-10-CM | POA: Diagnosis not present

## 2021-10-13 ENCOUNTER — Other Ambulatory Visit: Payer: Self-pay

## 2021-10-13 DIAGNOSIS — Z0283 Encounter for blood-alcohol and blood-drug test: Secondary | ICD-10-CM

## 2021-10-13 NOTE — Progress Notes (Signed)
Presents to [facility] for on-site collection or random DOT drug screen.  [facility] Acct #:  [id number] [facility] Specimen #:  [id number]   AMD

## 2021-10-25 DIAGNOSIS — G4733 Obstructive sleep apnea (adult) (pediatric): Secondary | ICD-10-CM | POA: Diagnosis not present

## 2021-11-23 DIAGNOSIS — G4733 Obstructive sleep apnea (adult) (pediatric): Secondary | ICD-10-CM | POA: Diagnosis not present

## 2021-12-23 DIAGNOSIS — G4733 Obstructive sleep apnea (adult) (pediatric): Secondary | ICD-10-CM | POA: Diagnosis not present

## 2022-01-19 ENCOUNTER — Other Ambulatory Visit: Payer: Self-pay

## 2022-01-19 DIAGNOSIS — Z0283 Encounter for blood-alcohol and blood-drug test: Secondary | ICD-10-CM

## 2022-01-19 NOTE — Progress Notes (Signed)
Presents to [facility] for on-site random DOT Drug Screen & Breath Alcohol Screen.  [facility] Acct #:  [id number] [facility] Specimen #:  [id number]  Breath Alcohol Results = .000  AMD

## 2022-01-23 DIAGNOSIS — G4733 Obstructive sleep apnea (adult) (pediatric): Secondary | ICD-10-CM | POA: Diagnosis not present

## 2022-02-21 DIAGNOSIS — G4733 Obstructive sleep apnea (adult) (pediatric): Secondary | ICD-10-CM | POA: Diagnosis not present

## 2022-03-23 DIAGNOSIS — G4733 Obstructive sleep apnea (adult) (pediatric): Secondary | ICD-10-CM | POA: Diagnosis not present

## 2022-04-05 DIAGNOSIS — M170 Bilateral primary osteoarthritis of knee: Secondary | ICD-10-CM | POA: Diagnosis not present

## 2022-04-23 DIAGNOSIS — G4733 Obstructive sleep apnea (adult) (pediatric): Secondary | ICD-10-CM | POA: Diagnosis not present

## 2022-05-23 DIAGNOSIS — G4733 Obstructive sleep apnea (adult) (pediatric): Secondary | ICD-10-CM | POA: Diagnosis not present

## 2022-06-23 DIAGNOSIS — G4733 Obstructive sleep apnea (adult) (pediatric): Secondary | ICD-10-CM | POA: Diagnosis not present

## 2022-07-24 DIAGNOSIS — G4733 Obstructive sleep apnea (adult) (pediatric): Secondary | ICD-10-CM | POA: Diagnosis not present

## 2022-08-21 DIAGNOSIS — G4733 Obstructive sleep apnea (adult) (pediatric): Secondary | ICD-10-CM | POA: Diagnosis not present

## 2022-09-13 DIAGNOSIS — Z1389 Encounter for screening for other disorder: Secondary | ICD-10-CM | POA: Diagnosis not present

## 2022-09-13 DIAGNOSIS — I10 Essential (primary) hypertension: Secondary | ICD-10-CM | POA: Diagnosis not present

## 2022-09-13 DIAGNOSIS — G4733 Obstructive sleep apnea (adult) (pediatric): Secondary | ICD-10-CM | POA: Diagnosis not present

## 2022-09-13 DIAGNOSIS — Z125 Encounter for screening for malignant neoplasm of prostate: Secondary | ICD-10-CM | POA: Diagnosis not present

## 2022-09-13 DIAGNOSIS — Z13220 Encounter for screening for lipoid disorders: Secondary | ICD-10-CM | POA: Diagnosis not present

## 2022-09-13 DIAGNOSIS — R7303 Prediabetes: Secondary | ICD-10-CM | POA: Diagnosis not present

## 2022-09-13 DIAGNOSIS — Z0000 Encounter for general adult medical examination without abnormal findings: Secondary | ICD-10-CM | POA: Diagnosis not present

## 2022-09-21 DIAGNOSIS — G4733 Obstructive sleep apnea (adult) (pediatric): Secondary | ICD-10-CM | POA: Diagnosis not present

## 2022-10-12 DIAGNOSIS — G4733 Obstructive sleep apnea (adult) (pediatric): Secondary | ICD-10-CM | POA: Diagnosis not present

## 2022-10-21 DIAGNOSIS — G4733 Obstructive sleep apnea (adult) (pediatric): Secondary | ICD-10-CM | POA: Diagnosis not present

## 2022-10-24 ENCOUNTER — Ambulatory Visit: Payer: Self-pay

## 2022-10-24 DIAGNOSIS — Z0000 Encounter for general adult medical examination without abnormal findings: Secondary | ICD-10-CM

## 2022-10-24 LAB — POCT URINALYSIS DIPSTICK
Bilirubin, UA: NEGATIVE
Blood, UA: NEGATIVE
Glucose, UA: NEGATIVE
Ketones, UA: NEGATIVE
Leukocytes, UA: NEGATIVE
Nitrite, UA: NEGATIVE
Protein, UA: NEGATIVE
Spec Grav, UA: 1.025 (ref 1.010–1.025)
Urobilinogen, UA: 0.2 E.U./dL
pH, UA: 6 (ref 5.0–8.0)

## 2022-10-24 NOTE — Progress Notes (Signed)
Pt completed labs for physical,/ CL,RMA

## 2022-10-25 LAB — CMP12+LP+TP+TSH+6AC+PSA+CBC…
ALT: 19 IU/L (ref 0–44)
AST: 18 IU/L (ref 0–40)
Albumin/Globulin Ratio: 1.8 (ref 1.2–2.2)
Albumin: 4.4 g/dL (ref 4.1–5.1)
Alkaline Phosphatase: 73 IU/L (ref 44–121)
BUN/Creatinine Ratio: 16 (ref 9–20)
BUN: 16 mg/dL (ref 6–24)
Basophils Absolute: 0 10*3/uL (ref 0.0–0.2)
Basos: 0 %
Bilirubin Total: 0.3 mg/dL (ref 0.0–1.2)
Calcium: 9.3 mg/dL (ref 8.7–10.2)
Chloride: 103 mmol/L (ref 96–106)
Chol/HDL Ratio: 3.1 ratio (ref 0.0–5.0)
Cholesterol, Total: 153 mg/dL (ref 100–199)
Creatinine, Ser: 1.02 mg/dL (ref 0.76–1.27)
EOS (ABSOLUTE): 0.1 10*3/uL (ref 0.0–0.4)
Eos: 2 %
Estimated CHD Risk: <0.5 times avg. (ref 0.0–1.0)
Free Thyroxine Index: 3.8 (ref 1.2–4.9)
GGT: 13 IU/L (ref 0–65)
Globulin, Total: 2.4 g/dL (ref 1.5–4.5)
Glucose: 86 mg/dL (ref 70–99)
HDL: 50 mg/dL (ref >39)
Hematocrit: 43 % (ref 37.5–51.0)
Hemoglobin: 13.7 g/dL (ref 13.0–17.7)
Immature Grans (Abs): 0 10*3/uL (ref 0.0–0.1)
Immature Granulocytes: 0 %
Iron: 38 ug/dL (ref 38–169)
LDH: 163 IU/L (ref 121–224)
LDL Chol Calc (NIH): 95 mg/dL (ref 0–99)
Lymphocytes Absolute: 2.5 10*3/uL (ref 0.7–3.1)
Lymphs: 48 %
MCH: 26.4 pg — ABNORMAL LOW (ref 26.6–33.0)
MCHC: 31.9 g/dL (ref 31.5–35.7)
MCV: 83 fL (ref 79–97)
Monocytes Absolute: 0.4 10*3/uL (ref 0.1–0.9)
Monocytes: 7 %
Neutrophils Absolute: 2.2 10*3/uL (ref 1.4–7.0)
Neutrophils: 43 %
Phosphorus: 3 mg/dL (ref 2.8–4.1)
Platelets: 193 10*3/uL (ref 150–450)
Potassium: 4.7 mmol/L (ref 3.5–5.2)
Prostate Specific Ag, Serum: 0.7 ng/mL (ref 0.0–4.0)
RBC: 5.18 x10E6/uL (ref 4.14–5.80)
RDW: 14.2 % (ref 11.6–15.4)
Sodium: 140 mmol/L (ref 134–144)
T3 Uptake Ratio: 39 % (ref 24–39)
T4, Total: 9.8 ug/dL (ref 4.5–12.0)
TSH: 2.84 u[IU]/mL (ref 0.450–4.500)
Total Protein: 6.8 g/dL (ref 6.0–8.5)
Triglycerides: 35 mg/dL (ref 0–149)
Uric Acid: 5.3 mg/dL (ref 3.8–8.4)
VLDL Cholesterol Cal: 8 mg/dL (ref 5–40)
WBC: 5.2 10*3/uL (ref 3.4–10.8)
eGFR: 91 mL/min/{1.73_m2} (ref >59)

## 2022-10-27 ENCOUNTER — Encounter: Payer: Self-pay | Admitting: Physician Assistant

## 2022-10-27 ENCOUNTER — Ambulatory Visit: Payer: Self-pay | Admitting: Physician Assistant

## 2022-10-27 VITALS — BP 131/69 | HR 78 | Resp 14 | Ht 69.0 in | Wt 215.0 lb

## 2022-10-27 DIAGNOSIS — Z0000 Encounter for general adult medical examination without abnormal findings: Secondary | ICD-10-CM

## 2022-10-27 NOTE — Progress Notes (Signed)
Pt presents today to complete physical, denies any issues just concerned of EKG results.

## 2022-10-27 NOTE — Progress Notes (Signed)
[facility]  ___________________________   None    (approximate)  I have reviewed the triage vital signs and the nursing notes.   HISTORY  Chief Complaint No chief complaint on file.  HPI Johnathan Owens is a 47 y.o. male patient presents for annual physical exam.  Patient voices no concerns or complaints.  Patient is lost 30 pounds with diet and exercise.        Past Medical History:  Diagnosis Date   Hypertension    Obesity    Serum sodium elevated     Patient Active Problem List   Diagnosis Date Noted   Daytime somnolence 11/24/2020   Hypertension 11/24/2020   Morbid obesity (HCC) 11/24/2020   Obstructive sleep apnea syndrome 11/24/2020   Osteoarthritis of knee 11/24/2020   Sciatica 11/24/2020   Knee pain, bilateral 08/17/2020    Past Surgical History:  Procedure Laterality Date   ANKLE SURGERY Right    While in High School    Prior to Admission medications   Medication Sig Start Date End Date Taking? Authorizing Provider  amLODipine-benazepril (LOTREL) 5-10 MG capsule Take 1 capsule by mouth daily. 09/08/20   [provider]  Loratadine 10 MG CAPS Take 10 mg by mouth daily. Patient not taking: Reported on 02/09/2021    [provider]  triamcinolone (NASACORT) 55 MCG/ACT AERO nasal inhaler two sprays by Both Nostrils route daily. 04/22/19 02/09/21  [provider]    Allergies Patient has no known allergies.  Family History  Problem Relation Age of Onset   Lung cancer Mother    Hypertension Father    Glaucoma Father     Social History Social History   Tobacco Use   Smoking status: Never   Smokeless tobacco: Never  Substance Use Topics   Alcohol use: Yes    Comment: Occasionally   Drug use: No    Review of Systems Constitutional: No fever/chills Eyes: No visual changes. ENT: No sore throat. Cardiovascular: Denies chest pain. Respiratory: Denies shortness of  breath. Gastrointestinal: No abdominal pain.  No nausea, no vomiting.  No diarrhea.  No constipation. Genitourinary: Negative for dysuria. Musculoskeletal: Negative for back pain. Skin: Negative for rash. Neurological: Negative for headaches, focal weakness or numbness.  ____________________________________________   PHYSICAL EXAM: VITAL SIGNS: BP 131/69  Pulse 78  Resp 14  SpO2 98 %  Weight 215 lb (97.5 kg)  Height 5\' 9"  (1.753 m)   BMI 31.75 kg/m2  BSA 2.18 m2   Constitutional: Alert and oriented. Well appearing and in no acute distress. Eyes: Conjunctivae are normal. PERRL. EOMI. Head: Atraumatic. Nose: No congestion/rhinnorhea. Mouth/Throat: Mucous membranes are moist.  Oropharynx non-erythematous. Neck: No stridor.  No cervical spine tenderness to palpation. Hematological/Lymphatic/Immunilogical: No cervical lymphadenopathy. Cardiovascular: Normal rate, regular rhythm. Grossly normal heart sounds.  Good peripheral circulation. Respiratory: Normal respiratory effort.  No retractions. Lungs CTAB. Gastrointestinal: Soft and nontender. No distention. No abdominal bruits. No CVA tenderness. Genitourinary: Deferred Musculoskeletal: No lower extremity tenderness nor edema.  No joint effusions. Neurologic:  Normal speech and language. No gross focal neurologic deficits are appreciated. No gait instability. Skin:  Skin is warm, dry and intact. No rash noted. Psychiatric: Mood and affect are normal. Speech and behavior are normal.  ____________________________________________   LABS          Component Ref Range & Units 3 d ago (10/24/22) 1 yr ago (11/17/20) 3 yr ago (10/08/19) 5 yr ago (03/02/17) 5 yr ago (03/02/17) 5 yr  ago (03/02/17) 5 yr ago (03/02/17)  Glucose 70 - 99 mg/dL 86 161 High  R 94 R 096 High  R  106 High  R   Uric Acid 3.8 - 8.4 mg/dL 5.3 5.8 CM 5.3 CM      Comment:            Therapeutic target for gout patients: <6.0  BUN 6 - 24 mg/dL 16 21 17 15  R  21  High  R   Creatinine, Ser 0.76 - 1.27 mg/dL 0.45 4.09 8.11 9.14 R  0.83 R   eGFR >59 mL/min/1.73 91 111       BUN/Creatinine Ratio 9 - 20 16 27  High  21 High       Sodium 134 - 144 mmol/L 140 142 141 140 R  142 R   Potassium 3.5 - 5.2 mmol/L 4.7 4.2 4.5 3.7 R  3.6 R   Chloride 96 - 106 mmol/L 103 105 105 109 R  107 R   Calcium 8.7 - 10.2 mg/dL 9.3 9.2 9.6 9.0 R  9.2 R   Phosphorus 2.8 - 4.1 mg/dL 3.0 2.7 Low  2.8      Total Protein 6.0 - 8.5 g/dL 6.8 6.9 7.5 6.8 R  7.3 R   Albumin 4.1 - 5.1 g/dL 4.4 4.6 R 4.5 R 3.9 R  4.2 R   Globulin, Total 1.5 - 4.5 g/dL 2.4 2.3 3.0      Albumin/Globulin Ratio 1.2 - 2.2 1.8 2.0 1.5      Bilirubin Total 0.0 - 1.2 mg/dL 0.3 0.3 0.5 1.1 R  0.7 R   Alkaline Phosphatase 44 - 121 IU/L 73 65 81 R 62 R  61 R   LDH 121 - 224 IU/L 163 218 237 High       AST 0 - 40 IU/L 18 19 24 31  R  30 R   ALT 0 - 44 IU/L 19 26 33 29 R  28 R   GGT 0 - 65 IU/L 13 23 25       Iron 38 - 169 ug/dL 38 52 86      Cholesterol, Total 100 - 199 mg/dL 782 956 213      Triglycerides 0 - 149 mg/dL 35 56 91      HDL >08 mg/dL 50 52 45      VLDL Cholesterol Cal 5 - 40 mg/dL 8 11 17       LDL Chol Calc (NIH) 0 - 99 mg/dL 95 657 High  846 High       Chol/HDL Ratio 0.0 - 5.0 ratio 3.1 3.3 CM 3.7 CM      Comment:                                   T. Chol/HDL Ratio                                             Men  Women                               1/2 Avg.Risk  3.4    3.3  Avg.Risk  5.0    4.4                                2X Avg.Risk  9.6    7.1                                3X Avg.Risk 23.4   11.0  Estimated CHD Risk 0.0 - 1.0 times avg.  < 0.5  < 0.5 CM 0.6 CM      Comment: The CHD Risk is based on the T. Chol/HDL ratio. Other factors affect CHD Risk such as hypertension, smoking, diabetes, severe obesity, and family history of premature CHD.  TSH 0.450 - 4.500 uIU/mL 2.840 2.360 3.620      T4, Total 4.5 - 12.0 ug/dL 9.8  8.2 8.7      T3 Uptake Ratio 24 - 39 % 39 35 31      Free Thyroxine Index 1.2 - 4.9 3.8 2.9 2.7      Prostate Specific Ag, Serum 0.0 - 4.0 ng/mL 0.7 0.9 CM 0.8 CM      Comment: Roche ECLIA methodology. According to the American Urological Association, Serum PSA should decrease and remain at undetectable levels after radical prostatectomy. The AUA defines biochemical recurrence as an initial PSA value 0.2 ng/mL or greater followed by a subsequent confirmatory PSA value 0.2 ng/mL or greater. Values obtained with different assay methods or kits cannot be used interchangeably. Results cannot be interpreted as absolute evidence of the presence or absence of malignant disease.  WBC 3.4 - 10.8 x10E3/uL 5.2 5.8 7.1  10.2 R  8.4 R  RBC 4.14 - 5.80 x10E6/uL 5.18 5.70 5.73  5.22 R  5.41 R  Hemoglobin 13.0 - 17.7 g/dL 93.7 16.9 67.8  93.8 R  14.1 R  Hematocrit 37.5 - 51.0 % 43.0 46.3 46.0  41.9 R  43.1 R  MCV 79 - 97 fL 83 81 80  80.3 R  79.6 Low  R  MCH 26.6 - 33.0 pg 26.4 Low  25.8 Low  25.5 Low   25.9 Low  R  26.0 R  MCHC 31.5 - 35.7 g/dL 10.1 75.1 02.5  85.2 R  32.7 R  RDW 11.6 - 15.4 % 14.2 13.4 13.6  14.7 R  15.0 High  R  Platelets 150 - 450 x10E3/uL 193 233 212  200 R  193 R  Neutrophils Not Estab. % 43 57 50      Lymphs Not Estab. % 48 36 41      Monocytes Not Estab. % 7 6 8       Eos Not Estab. % 2 1 1       Basos Not Estab. % 0 0 0      Neutrophils Absolute 1.4 - 7.0 x10E3/uL 2.2 3.3 3.5      Lymphocytes Absolute 0.7 - 3.1 x10E3/uL 2.5 2.1 2.9      Monocytes Absolute 0.1 - 0.9 x10E3/uL 0.4 0.4 0.6      EOS (ABSOLUTE) 0.0 - 0.4 x10E3/uL 0.1 0.1 0.1      Basophils Absolute 0.0 - 0.2 x10E3/uL 0.0 0.0 0.0      Immature Granulocytes Not Estab. % 0 0 0      Immature Grans (Abs)           _          Component  Ref Range & Units 3 d ago (10/24/22) 1 yr ago (02/09/21) 1 yr ago (11/17/20) 3 yr ago (10/08/19) 3 yr ago (03/19/19) 5 yr ago (03/02/17)  Color, UA yellow  yellow Yellow Yellow Yellow   Clarity, UA clear clear Clear Clear Clear   Glucose, UA Negative Negative Negative Negative Negative Negative   Bilirubin, UA neg negative Negative Negative Negative   Ketones, UA neg 1+ Negative Negative Negative   Spec Grav, UA 1.010 - 1.025 1.025 1.020 >=1.030 Abnormal  1.015 1.025   Blood, UA neg negative Positive CM Negative Negative   pH, UA 5.0 - 8.0 6.0 6.5 5.5 8.0 6.0   Protein, UA Negative Negative Negative Negative Negative Negative   Urobilinogen, UA 0.2 or 1.0 E.U./dL 0.2 0.2 0.2 0.2 0.2   Nitrite, UA neg negative Negative Negative Negative   Leukocytes, UA Negative Negative Negative Negative Negative Negative NEGATIVE R  Appearance dark medium Normal   CLEAR R            __________________________________________  EKG Bradycardic at 59 bpm  ____________________________________________    ____________________________________________   INITIAL IMPRESSION / ASSESSMENT AND PLAN  As part of my medical decision making, I reviewed the following data within the electronic MEDICAL RECORD NUMBER       No acute findings on physical exam EKG, and labs.      ____________________________________________   FINAL CLINICAL IMPRESSION  Well exam   ED Discharge Orders     None        Note:  This document was prepared using Dragon voice recognition software and may include unintentional dictation errors.

## 2022-10-30 ENCOUNTER — Encounter: Payer: 59 | Admitting: Physician Assistant

## 2023-01-19 ENCOUNTER — Emergency Department
Admission: EM | Admit: 2023-01-19 | Discharge: 2023-01-19 | Disposition: A | Discharge status: Home / Self Care | Payer: 59 | Attending: Emergency Medicine | Admitting: Emergency Medicine

## 2023-01-19 ENCOUNTER — Emergency Department: Payer: 59

## 2023-01-19 DIAGNOSIS — K5901 Slow transit constipation: Secondary | ICD-10-CM | POA: Diagnosis not present

## 2023-01-19 DIAGNOSIS — K4090 Unilateral inguinal hernia, without obstruction or gangrene, not specified as recurrent: Secondary | ICD-10-CM | POA: Diagnosis not present

## 2023-01-19 DIAGNOSIS — K429 Umbilical hernia without obstruction or gangrene: Secondary | ICD-10-CM | POA: Diagnosis not present

## 2023-01-19 DIAGNOSIS — K5900 Constipation, unspecified: Secondary | ICD-10-CM | POA: Diagnosis not present

## 2023-01-19 DIAGNOSIS — K5730 Diverticulosis of large intestine without perforation or abscess without bleeding: Secondary | ICD-10-CM | POA: Diagnosis not present

## 2023-01-19 DIAGNOSIS — I10 Essential (primary) hypertension: Secondary | ICD-10-CM | POA: Diagnosis not present

## 2023-01-19 DIAGNOSIS — R1084 Generalized abdominal pain: Secondary | ICD-10-CM | POA: Diagnosis not present

## 2023-01-19 LAB — CBC WITH DIFFERENTIAL/PLATELET
Abs Immature Granulocytes: 0.01 10*3/uL (ref 0.00–0.07)
Basophils Absolute: 0 10*3/uL (ref 0.0–0.1)
Basophils Relative: 0 %
Eosinophils Absolute: 0 10*3/uL (ref 0.0–0.5)
Eosinophils Relative: 0 %
HCT: 43.2 % (ref 39.0–52.0)
Hemoglobin: 13.8 g/dL (ref 13.0–17.0)
Immature Granulocytes: 0 %
Lymphocytes Relative: 19 %
Lymphs Abs: 1 10*3/uL (ref 0.7–4.0)
MCH: 25.9 pg — ABNORMAL LOW (ref 26.0–34.0)
MCHC: 31.9 g/dL (ref 30.0–36.0)
MCV: 81.2 fL (ref 80.0–100.0)
Monocytes Absolute: 0.4 10*3/uL (ref 0.1–1.0)
Monocytes Relative: 8 %
Neutro Abs: 4 10*3/uL (ref 1.7–7.7)
Neutrophils Relative %: 73 %
Platelets: 161 10*3/uL (ref 150–400)
RBC: 5.32 MIL/uL (ref 4.22–5.81)
RDW: 14.9 % (ref 11.5–15.5)
WBC: 5.5 10*3/uL (ref 4.0–10.5)
nRBC: 0 % (ref 0.0–0.2)

## 2023-01-19 LAB — COMPREHENSIVE METABOLIC PANEL
ALT: 23 U/L (ref 0–44)
AST: 24 U/L (ref 15–41)
Albumin: 4.2 g/dL (ref 3.5–5.0)
Alkaline Phosphatase: 57 U/L (ref 38–126)
Anion gap: 13 (ref 5–15)
BUN: 15 mg/dL (ref 6–20)
CO2: 24 mmol/L (ref 22–32)
Calcium: 9.4 mg/dL (ref 8.9–10.3)
Chloride: 103 mmol/L (ref 98–111)
Creatinine, Ser: 0.77 mg/dL (ref 0.61–1.24)
GFR, Estimated: >60 mL/min (ref >60)
Glucose, Bld: 100 mg/dL — ABNORMAL HIGH (ref 70–99)
Potassium: 4.3 mmol/L (ref 3.5–5.1)
Sodium: 140 mmol/L (ref 135–145)
Total Bilirubin: 0.6 mg/dL (ref 0.3–1.2)
Total Protein: 7.7 g/dL (ref 6.5–8.1)

## 2023-01-19 LAB — LIPASE, BLOOD: Lipase: 40 U/L (ref 11–51)

## 2023-01-19 MED ORDER — IOHEXOL 300 MG/ML  SOLN
100.0000 mL | Freq: Once | INTRAMUSCULAR | Status: AC | PRN
  Administered 2023-01-19: 100 mL via INTRAVENOUS

## 2023-01-19 MED ORDER — DOCUSATE SODIUM 100 MG PO CAPS: 100.0000 mg | ORAL_CAPSULE | Freq: Every day | ORAL | 2 refills | Status: AC | PRN

## 2023-01-19 NOTE — ED Provider Notes (Signed)
[facility] Provider Note    Event Date/Time   First MD Initiated Contact with Patient 01/19/23 0703     (approximate)   History   Abdominal Pain (Pt states he had gas station meatloaf at 130 pm yesterday and began having severe abd pain after. Pt denies n/v/d. Pt denies fever. )   HPI Johnathan Owens is a 47 y.o. male with HTN, obesity who presents today for abdominal pain.  Patient states he was eating meatloaf yesterday on several hours after he started developing severe generalized abdominal pain.  Notes pain comes in waves.  Denies nausea, vomiting, diarrhea.  Last bowel movement 2 days ago but is still passing gas.  No fevers, chest pain, or shortness of breath.  No prior abdominal surgeries.     Physical Exam   Triage Vital Signs: ED Triage Vitals  Encounter Vitals Group     BP 01/19/23 0654 (!) 154/81     Systolic BP Percentile --      Diastolic BP Percentile --      Pulse Rate 01/19/23 0654 81     Resp 01/19/23 0654 12     Temp 01/19/23 0654 98.5 F (36.9 C)     Temp Source 01/19/23 0654 Oral     SpO2 01/19/23 0654 100 %     Weight 01/19/23 0655 191 lb (86.6 kg)     Height 01/19/23 0655 5\' 8"  (1.727 m)     Head Circumference --      Peak Flow --      Pain Score 01/19/23 0655 9     Pain Loc --      Pain Education --      Exclude from Growth Chart --     Most recent vital signs: Vitals:   01/19/23 0654  BP: (!) 154/81  Pulse: 81  Resp: 12  Temp: 98.5 F (36.9 C)  SpO2: 100%   Physical Exam: I have reviewed the vital signs and nursing notes. General: Awake, alert, no acute distress.  Nontoxic appearing. Head:  Atraumatic, normocephalic.   ENT:  EOM intact, PERRL. Oral mucosa is pink and moist with no lesions. Neck: Neck is supple with full range of motion, No meningeal signs. Cardiovascular:  RRR, No murmurs. Peripheral pulses palpable and equal bilaterally. Respiratory:  Symmetrical chest wall expansion.  No rhonchi, rales,  or wheezes.  Good air movement throughout.  No use of accessory muscles.   Musculoskeletal:  No cyanosis or edema. Moving extremities with full ROM Abdomen:  Soft, mild generalized tenderness to palpation, nondistended. Neuro:  GCS 15, moving all four extremities, interacting appropriately. Speech clear. Psych:  Calm, appropriate.   Skin:  Warm, dry, no rash.     ED Results / Procedures / Treatments   Labs (all labs ordered are listed, but only abnormal results are displayed) Labs Reviewed  CBC WITH DIFFERENTIAL/PLATELET - Abnormal; Notable for the following components:      Result Value   MCH 25.9 (*)    All other components within normal limits  COMPREHENSIVE METABOLIC PANEL - Abnormal; Notable for the following components:   Glucose, Bld 100 (*)    All other components within normal limits  LIPASE, BLOOD  URINALYSIS, ROUTINE W REFLEX MICROSCOPIC     EKG    RADIOLOGY See ED course for my independent interpretation   PROCEDURES:  Critical Care performed: No  Procedures   MEDICATIONS ORDERED IN ED: Medications  iohexol (OMNIPAQUE) 300 MG/ML solution 100 mL (100 mLs  Intravenous Contrast Given 01/19/23 0733)     IMPRESSION / MDM / ASSESSMENT AND PLAN / ED COURSE  I reviewed the triage vital signs and the nursing notes.                              Differential diagnosis includes, but is not limited to, constipation, SBO, pancreatitis, diverticulitis, cholecystitis.  Patient's presentation is most consistent with acute complicated illness / injury requiring diagnostic workup.  Patient is a 46 year old male presenting today for generalized abdominal pain following eating.  Physical exam largely reassuring and vital signs stable.  Laboratory workup reassuring.  CT abdomen/pelvis shows severe constipation throughout but no further acute concerns.  Patient is stable on reassessment and safe for discharge with bowel regiment outpatient.  He was given strict return  precautions and told to follow-up with his primary care provider.  The patient is on the cardiac monitor to evaluate for evidence of arrhythmia and/or significant heart rate changes. Clinical Course as of 01/19/23 4010  Fri Jan 19, 2023  0707 CBC with Differential/Platelet(!) unremarkable [DW]  0728 Comprehensive metabolic panel(!) unremarkable [DW]  0728 Lipase: 40 [DW]  0802 CT ABDOMEN PELVIS W CONTRAST Per my independent interpretation: Constipation but no evidence of small bowel obstruction.  Otherwise reassuring abdominal exam. [DW]    Clinical Course User Index [DW] Janith Lima, MD     FINAL CLINICAL IMPRESSION(S) / ED DIAGNOSES   Final diagnoses:  Slow transit constipation     Rx / DC Orders   ED Discharge Orders          Ordered    docusate sodium (COLACE) 100 MG capsule  Daily PRN        01/19/23 0807             Note:  This document was prepared using Dragon voice recognition software and may include unintentional dictation errors.   Janith Lima, MD 01/19/23 [phone]

## 2023-01-19 NOTE — Discharge Instructions (Signed)
You were seen in the emergency department today for abdominal pain.  CT imaging of your abdomen was reassuring at this time and notes evidence of constipation but no further concerning findings.  Your laboratory workup was reassuring.  Please follow-up with your primary care provider for any ongoing workup.  I recommend he start on a MiraLAX regimen over the next week.  You can do 2 capfuls in the morning of MiraLAX daily until you have more frequent stools, at least once daily, and then begin to taper off.

## 2023-01-22 DIAGNOSIS — R109 Unspecified abdominal pain: Secondary | ICD-10-CM | POA: Diagnosis not present

## 2023-01-22 DIAGNOSIS — K5790 Diverticulosis of intestine, part unspecified, without perforation or abscess without bleeding: Secondary | ICD-10-CM | POA: Diagnosis not present

## 2023-05-01 ENCOUNTER — Other Ambulatory Visit: Payer: Self-pay

## 2023-09-19 DIAGNOSIS — M179 Osteoarthritis of knee, unspecified: Secondary | ICD-10-CM | POA: Diagnosis not present

## 2023-09-19 DIAGNOSIS — Z8709 Personal history of other diseases of the respiratory system: Secondary | ICD-10-CM | POA: Diagnosis not present

## 2023-09-19 DIAGNOSIS — Z0000 Encounter for general adult medical examination without abnormal findings: Secondary | ICD-10-CM | POA: Diagnosis not present

## 2023-09-19 DIAGNOSIS — Z125 Encounter for screening for malignant neoplasm of prostate: Secondary | ICD-10-CM | POA: Diagnosis not present

## 2023-09-19 DIAGNOSIS — R7303 Prediabetes: Secondary | ICD-10-CM | POA: Diagnosis not present

## 2023-09-19 DIAGNOSIS — Z1389 Encounter for screening for other disorder: Secondary | ICD-10-CM | POA: Diagnosis not present

## 2023-09-19 DIAGNOSIS — I10 Essential (primary) hypertension: Secondary | ICD-10-CM | POA: Diagnosis not present

## 2023-10-23 DIAGNOSIS — R52 Pain, unspecified: Secondary | ICD-10-CM | POA: Diagnosis not present

## 2023-10-23 DIAGNOSIS — R5383 Other fatigue: Secondary | ICD-10-CM | POA: Diagnosis not present

## 2023-10-23 DIAGNOSIS — Z03818 Encounter for observation for suspected exposure to other biological agents ruled out: Secondary | ICD-10-CM | POA: Diagnosis not present

## 2023-10-23 DIAGNOSIS — J029 Acute pharyngitis, unspecified: Secondary | ICD-10-CM | POA: Diagnosis not present

## 2024-03-05 DIAGNOSIS — S83411A Sprain of medial collateral ligament of right knee, initial encounter: Secondary | ICD-10-CM | POA: Diagnosis not present
